# Patient Record
Sex: Female | Born: 1980
Health system: Southern US, Community
[De-identification: ages and names within clinical notes are randomized; demographics above are authoritative.]

## PROBLEM LIST (undated history)

## (undated) ENCOUNTER — Inpatient Hospital Stay (HOSPITAL_COMMUNITY): Payer: Self-pay

## (undated) DIAGNOSIS — R51 Headache: Secondary | ICD-10-CM

## (undated) DIAGNOSIS — R519 Headache, unspecified: Secondary | ICD-10-CM

## (undated) DIAGNOSIS — F329 Major depressive disorder, single episode, unspecified: Secondary | ICD-10-CM

## (undated) DIAGNOSIS — E119 Type 2 diabetes mellitus without complications: Secondary | ICD-10-CM

## (undated) DIAGNOSIS — F909 Attention-deficit hyperactivity disorder, unspecified type: Secondary | ICD-10-CM

## (undated) DIAGNOSIS — F32A Depression, unspecified: Secondary | ICD-10-CM

## (undated) HISTORY — PX: NO PAST SURGERIES: SHX2092

## (undated) HISTORY — DX: Type 2 diabetes mellitus without complications: E11.9

---

## 2007-06-24 ENCOUNTER — Ambulatory Visit (HOSPITAL_COMMUNITY): Admission: RE | Admit: 2007-06-24 | Discharge: 2007-06-24 | Payer: Self-pay | Admitting: Obstetrics & Gynecology

## 2007-09-16 ENCOUNTER — Ambulatory Visit (HOSPITAL_COMMUNITY): Admission: RE | Admit: 2007-09-16 | Discharge: 2007-09-16 | Payer: Self-pay | Admitting: Obstetrics & Gynecology

## 2007-11-08 ENCOUNTER — Inpatient Hospital Stay (HOSPITAL_COMMUNITY): Admission: AD | Admit: 2007-11-08 | Discharge: 2007-11-08 | Payer: Self-pay | Admitting: Obstetrics & Gynecology

## 2007-11-22 ENCOUNTER — Inpatient Hospital Stay (HOSPITAL_COMMUNITY): Admission: AD | Admit: 2007-11-22 | Discharge: 2007-11-24 | Payer: Self-pay | Admitting: Obstetrics & Gynecology

## 2010-12-04 ENCOUNTER — Encounter: Payer: Self-pay | Admitting: Obstetrics & Gynecology

## 2011-08-01 ENCOUNTER — Emergency Department (HOSPITAL_COMMUNITY)
Admission: EM | Admit: 2011-08-01 | Discharge: 2011-08-01 | Disposition: A | Discharge status: Home / Self Care | Payer: Medicaid Other | Attending: Emergency Medicine | Admitting: Emergency Medicine

## 2011-08-01 DIAGNOSIS — M79609 Pain in unspecified limb: Secondary | ICD-10-CM | POA: Insufficient documentation

## 2011-08-01 DIAGNOSIS — X503XXA Overexertion from repetitive movements, initial encounter: Secondary | ICD-10-CM | POA: Insufficient documentation

## 2011-08-01 DIAGNOSIS — S4490XA Injury of unspecified nerve at shoulder and upper arm level, unspecified arm, initial encounter: Secondary | ICD-10-CM | POA: Insufficient documentation

## 2011-08-01 DIAGNOSIS — M25519 Pain in unspecified shoulder: Secondary | ICD-10-CM | POA: Insufficient documentation

## 2011-08-03 LAB — CBC
Hemoglobin: 11.2 — ABNORMAL LOW
MCHC: 33
MCHC: 33.3
MCV: 76.3 — ABNORMAL LOW
MCV: 76.6 — ABNORMAL LOW
RBC: 4.44
WBC: 9.5

## 2011-08-03 LAB — RPR: RPR Ser Ql: NONREACTIVE

## 2011-08-18 LAB — URINALYSIS, ROUTINE W REFLEX MICROSCOPIC
Specific Gravity, Urine: 1.015
pH: 5.5

## 2012-09-05 DIAGNOSIS — F988 Other specified behavioral and emotional disorders with onset usually occurring in childhood and adolescence: Secondary | ICD-10-CM | POA: Insufficient documentation

## 2012-09-05 DIAGNOSIS — F419 Anxiety disorder, unspecified: Secondary | ICD-10-CM | POA: Insufficient documentation

## 2012-12-19 ENCOUNTER — Emergency Department (HOSPITAL_COMMUNITY)
Admission: EM | Admit: 2012-12-19 | Discharge: 2012-12-20 | Disposition: A | Discharge status: Home / Self Care | Payer: Medicaid Other | Attending: Emergency Medicine | Admitting: Emergency Medicine

## 2012-12-19 ENCOUNTER — Emergency Department (HOSPITAL_COMMUNITY): Payer: Medicaid Other

## 2012-12-19 ENCOUNTER — Encounter (HOSPITAL_COMMUNITY): Payer: Self-pay | Admitting: Cardiology

## 2012-12-19 DIAGNOSIS — R5381 Other malaise: Secondary | ICD-10-CM | POA: Insufficient documentation

## 2012-12-19 DIAGNOSIS — R42 Dizziness and giddiness: Secondary | ICD-10-CM | POA: Insufficient documentation

## 2012-12-19 DIAGNOSIS — Z7982 Long term (current) use of aspirin: Secondary | ICD-10-CM | POA: Insufficient documentation

## 2012-12-19 DIAGNOSIS — IMO0001 Reserved for inherently not codable concepts without codable children: Secondary | ICD-10-CM | POA: Insufficient documentation

## 2012-12-19 DIAGNOSIS — R110 Nausea: Secondary | ICD-10-CM | POA: Insufficient documentation

## 2012-12-19 DIAGNOSIS — Z3202 Encounter for pregnancy test, result negative: Secondary | ICD-10-CM | POA: Insufficient documentation

## 2012-12-19 DIAGNOSIS — R51 Headache: Secondary | ICD-10-CM | POA: Insufficient documentation

## 2012-12-19 DIAGNOSIS — H53149 Visual discomfort, unspecified: Secondary | ICD-10-CM | POA: Insufficient documentation

## 2012-12-19 DIAGNOSIS — Z79899 Other long term (current) drug therapy: Secondary | ICD-10-CM | POA: Insufficient documentation

## 2012-12-19 DIAGNOSIS — R1012 Left upper quadrant pain: Secondary | ICD-10-CM | POA: Insufficient documentation

## 2012-12-19 MED ORDER — KETOROLAC TROMETHAMINE 30 MG/ML IJ SOLN
30.0000 mg | Freq: Once | INTRAMUSCULAR | Status: AC
  Administered 2012-12-19: 30 mg via INTRAVENOUS
  Filled 2012-12-19: qty 1

## 2012-12-19 MED ORDER — ONDANSETRON 4 MG PO TBDP
4.0000 mg | ORAL_TABLET | Freq: Once | ORAL | Status: AC
  Administered 2012-12-19: 4 mg via ORAL
  Filled 2012-12-19: qty 1

## 2012-12-19 MED ORDER — PROMETHAZINE HCL 25 MG/ML IJ SOLN
25.0000 mg | Freq: Once | INTRAMUSCULAR | Status: AC
  Administered 2012-12-19: 25 mg via INTRAVENOUS
  Filled 2012-12-19: qty 1

## 2012-12-19 MED ORDER — METOCLOPRAMIDE HCL 5 MG/ML IJ SOLN
10.0000 mg | Freq: Once | INTRAMUSCULAR | Status: AC
  Administered 2012-12-19: 10 mg via INTRAVENOUS
  Filled 2012-12-19: qty 2

## 2012-12-19 MED ORDER — MORPHINE SULFATE 4 MG/ML IJ SOLN: 4.0000 mg | Freq: Once | INTRAMUSCULAR | Status: DC

## 2012-12-19 NOTE — ED Notes (Signed)
Pt states was given Toradol around 1:30pm from doctors office and did not help pain; pt states doctor told her to come here to be evaluated further; pt has no hx of migraines; pt denies numbness and tingling; denies nausea; pt states dizzy/lightheaded upon movement. Pt alert and mentating appropriately; pt states lights bothering her.

## 2012-12-19 NOTE — ED Notes (Signed)
Pt called for vital signs recheck x 1 with no answer.

## 2012-12-19 NOTE — ED Notes (Signed)
Pt reports a headache that started on Tuesday and has continued to get worse. Reports that she went to her PCP and received toradol but pain has gotten worse. Also reports upper quadrant abd pain. Denies any changes in her vision but does feel dizzy.

## 2012-12-20 NOTE — ED Provider Notes (Signed)
Medical screening examination/treatment/procedure(s) were conducted as a shared visit with non-physician practitioner(s) and myself.  I personally evaluated the patient during the encounter. Headache. Non-focal. Doubt meningitis. Feels better after treatment. Will d/c  Juliet Rude. Rubin Payor, MD 12/20/12 1441

## 2012-12-20 NOTE — ED Provider Notes (Signed)
History     CSN: [id number]  Arrival date & time 12/19/12  1829   First MD Initiated Contact with Patient 12/19/12 2121      Chief Complaint  Patient presents with  . Headache  . Abdominal Pain    (Consider location/radiation/quality/duration/timing/severity/associated sxs/prior treatment) Patient is a 32 y.o. female presenting with headaches. The history is provided by the patient.  Headache  This is a new problem. The problem has been gradually worsening. The headache is associated with bright light. The pain is located in the frontal region. The quality of the pain is described as dull. The pain is at a severity of 10/10. The pain is severe. The pain does not radiate. Associated symptoms include malaise/fatigue and nausea. Pertinent negatives include no fever, no palpitations, no shortness of breath and no vomiting. Associated symptoms comments: photophobia. Treatments tried: Extra strength tylenol helpd the first day. Went to PCP today and receivd toradol which helped for a few hours but then pain returned without relief.    History reviewed. No pertinent past medical history.  History reviewed. No pertinent past surgical history.  History reviewed. No pertinent family history.  History  Substance Use Topics  . Smoking status: Never Smoker   . Smokeless tobacco: Not on file  . Alcohol Use: No    OB History    Grav Para Term Preterm Abortions TAB SAB Ect Mult Living                  Review of Systems  Constitutional: Positive for malaise/fatigue. Negative for fever and diaphoresis.  HENT: Negative for neck pain and neck stiffness.   Eyes: Positive for photophobia. Negative for visual disturbance.  Respiratory: Negative for apnea, chest tightness and shortness of breath.   Cardiovascular: Negative for chest pain and palpitations.  Gastrointestinal: Positive for nausea. Negative for vomiting, diarrhea and constipation.  Genitourinary: Negative for dysuria.   Musculoskeletal: Negative for gait problem.  Skin: Negative for rash.  Neurological: Positive for headaches. Negative for dizziness, weakness, light-headedness and numbness.    Allergies  Review of patient's allergies indicates no known allergies.  Home Medications   Current Outpatient Rx  Name  Route  Sig  Dispense  Refill  . ACETAMINOPHEN 500 MG PO TABS   Oral   Take 500 mg by mouth every 6 (six) hours as needed. For pain         . AMPHETAMINE-DEXTROAMPHET ER 30 MG PO CP24   Oral   Take 30 mg by mouth daily.         . ASPIRIN PO   Oral   Take 2 tablets by mouth daily as needed. For headache         . CITALOPRAM HYDROBROMIDE 10 MG PO TABS   Oral   Take 10 mg by mouth daily.         . ETONOGESTREL-ETHINYL ESTRADIOL 0.12-0.015 MG/24HR VA RING   Vaginal   Place 1 each vaginally every 28 (twenty-eight) days. Insert vaginally and leave in place for 3 consecutive weeks, then remove for 1 week.         . TORADOL ORAL PO   Oral   Take 1 each by mouth once.         . ADULT MULTIVITAMIN W/MINERALS CH   Oral   Take 1 tablet by mouth daily.           BP 121/59  Pulse 108  Temp 98.1 F (36.7 C) (Oral)  Resp 18  SpO2 100%  LMP 10/16/2012  Physical Exam  Nursing note and vitals reviewed. Constitutional: She is oriented to person, place, and time. She appears well-developed.       Ill appearing  HENT:  Head: Normocephalic and atraumatic.  Eyes: Conjunctivae normal and EOM are normal. Pupils are equal, round, and reactive to light.  Neck: Normal range of motion. Neck supple.       No meningeal signs  Cardiovascular: Normal rate, regular rhythm and normal heart sounds.  Exam reveals no gallop and no friction rub.   No murmur heard. Pulmonary/Chest: Effort normal and breath sounds normal. No respiratory distress. She has no wheezes. She has no rales. She exhibits no tenderness.  Abdominal: Soft. Bowel sounds are normal. She exhibits no distension. There is  no tenderness. There is no rebound and no guarding.       Abdominal discomfort, LUQ. Not tender to palpation.  Musculoskeletal: Normal range of motion. She exhibits no edema and no tenderness.       Good grip strength. 5/5 muscle strength throughout. FROM  Neurological: She is alert and oriented to person, place, and time. No cranial nerve deficit.       No focal deficits, sensitivity to light touch intact, no facial droop, no pronator drift  Skin: Skin is warm and dry. No erythema.    ED Course  Procedures (including critical care time)   Labs Reviewed  PREGNANCY, URINE   Ct Head Wo Contrast  12/19/2012  *RADIOLOGY REPORT*  Clinical Data: Dizziness and headache.  Irregular periods.  Birth control.  CT HEAD WITHOUT CONTRAST  Technique:  Contiguous axial images were obtained from the base of the skull through the vertex without contrast.  Comparison: None.  Findings: No intracranial hemorrhage.  No hydrocephalus.  No CT evidence of large acute infarct.  No intracranial mass lesion detected on this unenhanced exam.  Sella appears partially empty.  Slight asymmetry of the crista gali but without mass seen.  Visualized sinuses, mastoid air cells and middle ear cavities are clear.  IMPRESSION: No acute abnormality.  Please see above.   Original Report Authenticated By: Lacy Duverney, M.D.      1. Headache       MDM  Neuro exam shows no deficit. Cranial nerves intact. Ct negative. Headache cocktail successful bringing pain from 10/10 to 0/10. Pt looks much better. Ready to go home. Will follow up with Dr. Cyndia Bent.  At this time there does not appear to be any evidence of an acute emergency medical condition and the patient appears stable for discharge with appropriate outpatient follow up.Diagnosis was discussed with patient who verbalizes understanding and is agreeable to discharge. Pt case discussed with Dr. Rubin Payor who agrees with my plan.    Glade Nurse, PA-C 12/20/12 [phone]

## 2014-02-25 IMAGING — CT CT HEAD W/O CM
1 series · 16 of 30 positions shown, 20 images · non-contrast
Comparison: None.

CLINICAL DATA: Dizziness and headache.  Irregular periods.  Birth
control.

CT HEAD WITHOUT CONTRAST
TECHNIQUE: Contiguous axial images were obtained from the base of
the skull through the vertex without contrast.

[Series 2: head routine 4.8 h37s · axial · 0.43mm/px · z∈[+1130,+1267]mm · 16 of 30 slices shown, 20 images]
[im 2/30  brain]
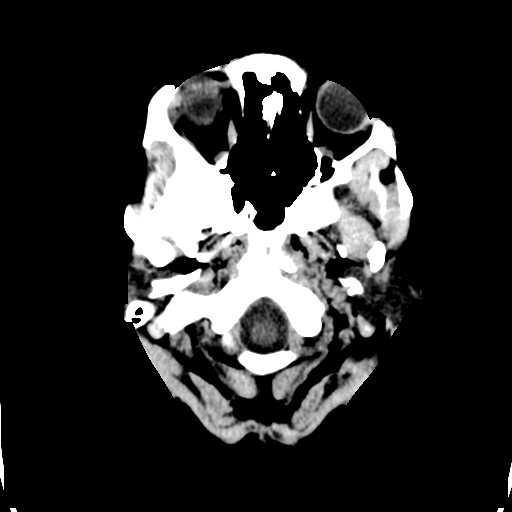
[im 2/30  bone]
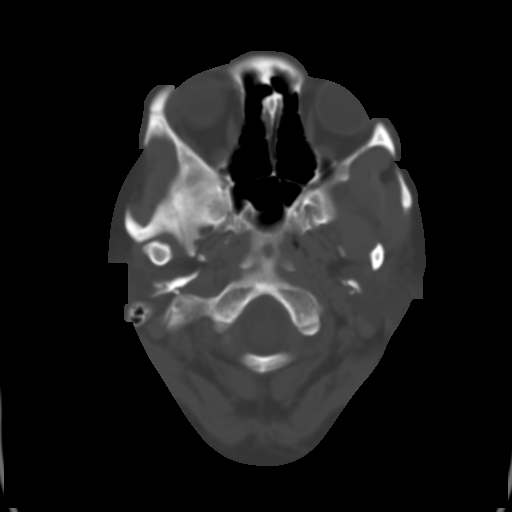
[im 4/30  brain]
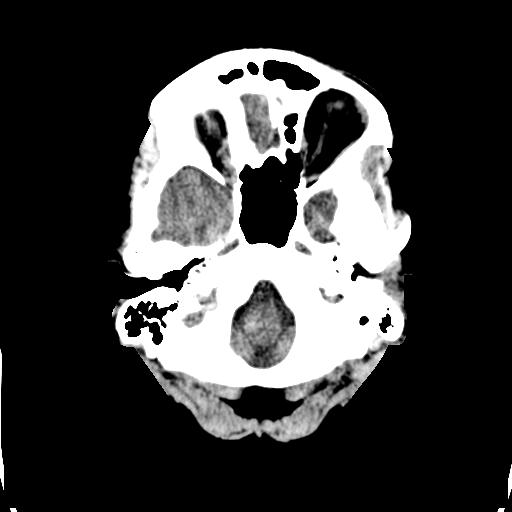
[im 6/30  brain]
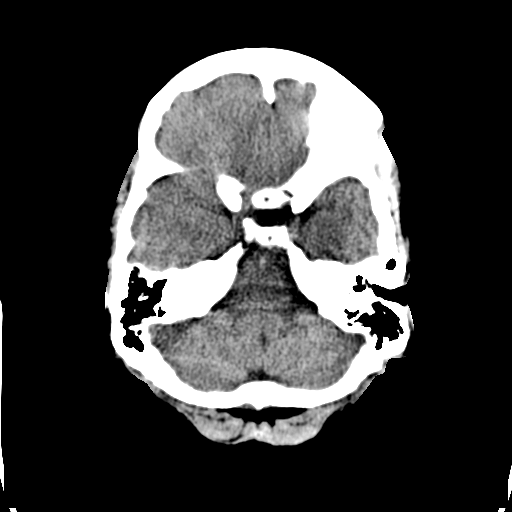
[im 8/30  brain]
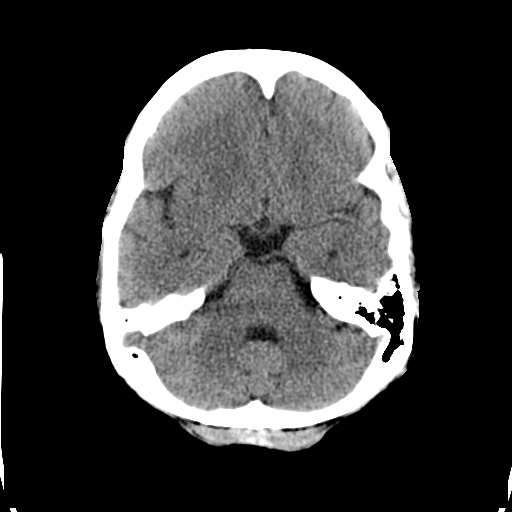
[im 9/30  brain]
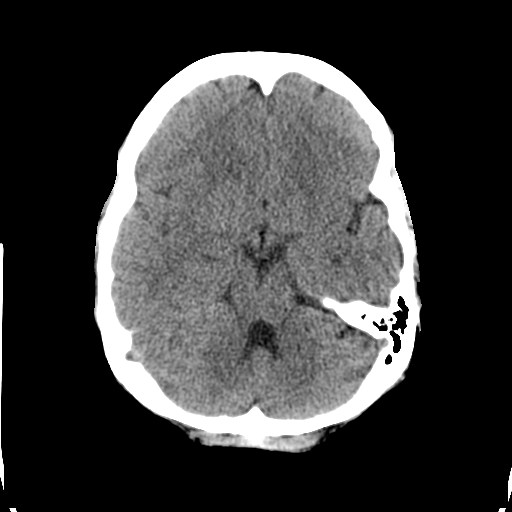
[im 9/30  bone]
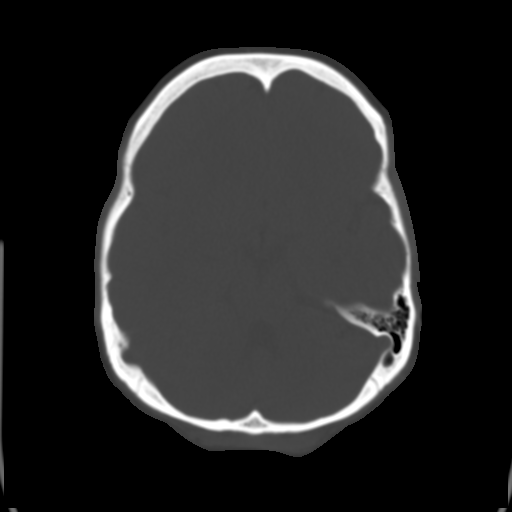
[im 11/30  brain]
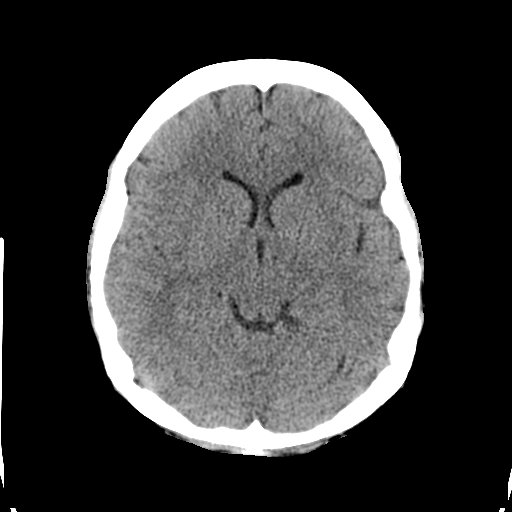
[im 13/30  brain]
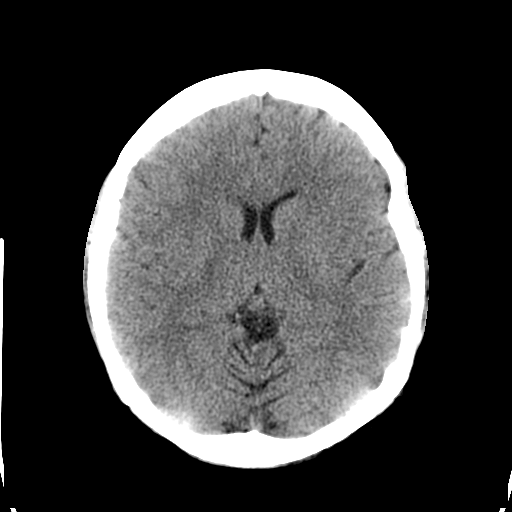
[im 15/30  brain]
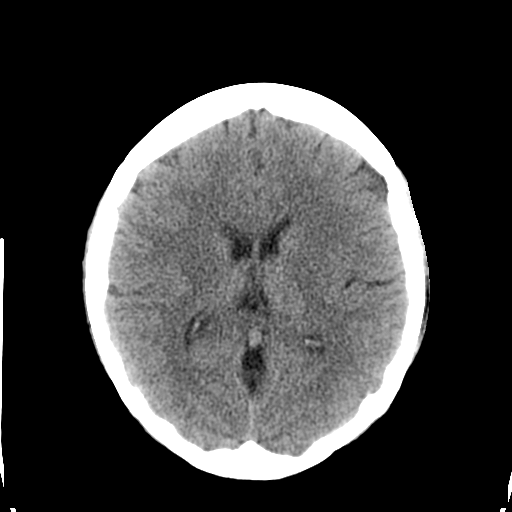
[im 16/30  brain]
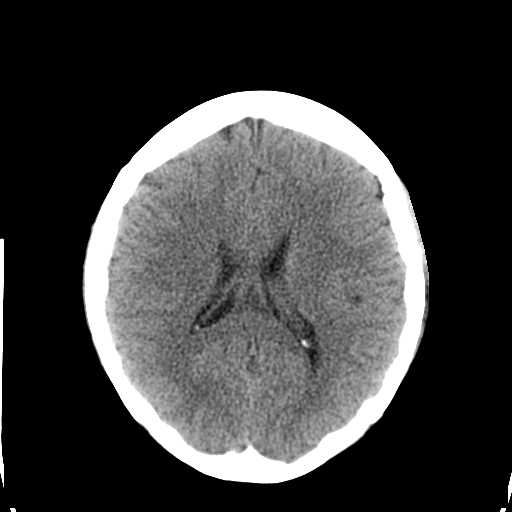
[im 16/30  bone]
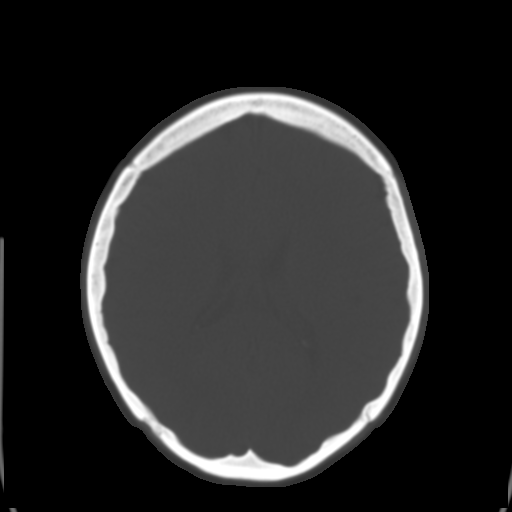
[im 18/30  brain]
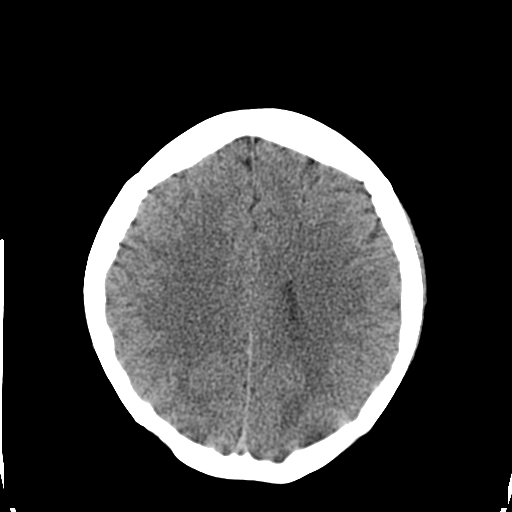
[im 20/30  brain]
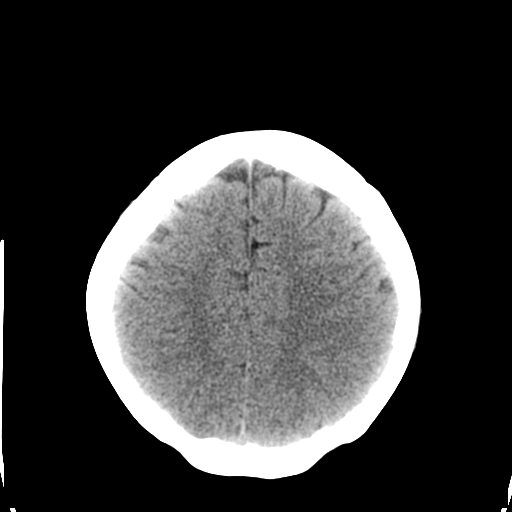
[im 22/30  brain]
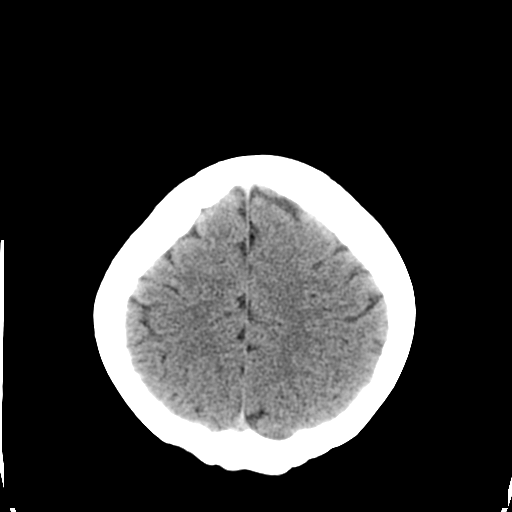
[im 23/30  brain]
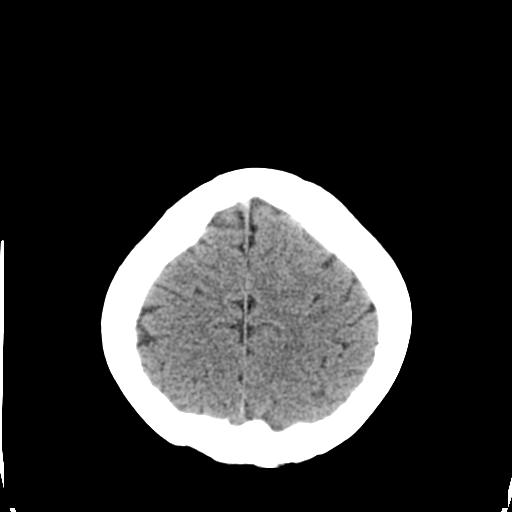
[im 23/30  bone]
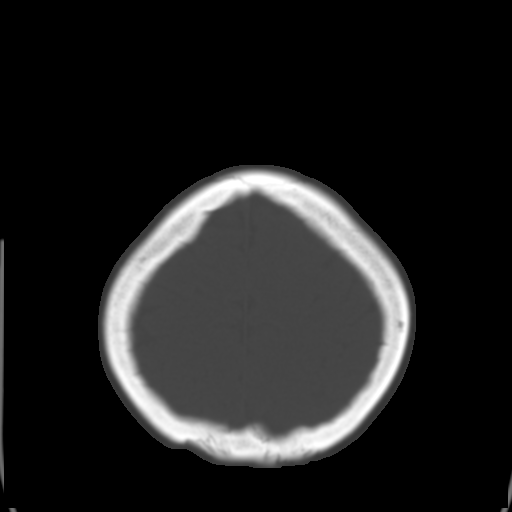
[im 25/30  brain]
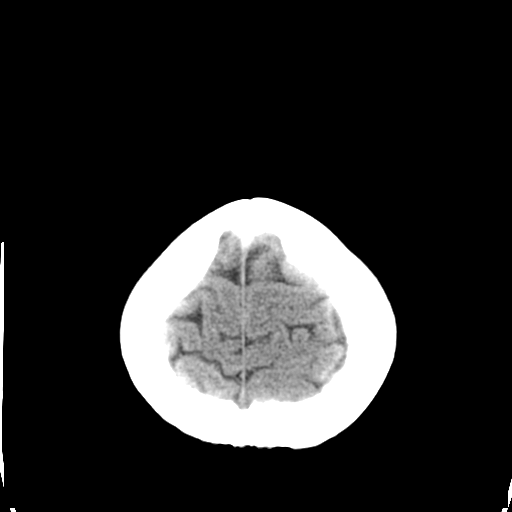
[im 27/30  brain]
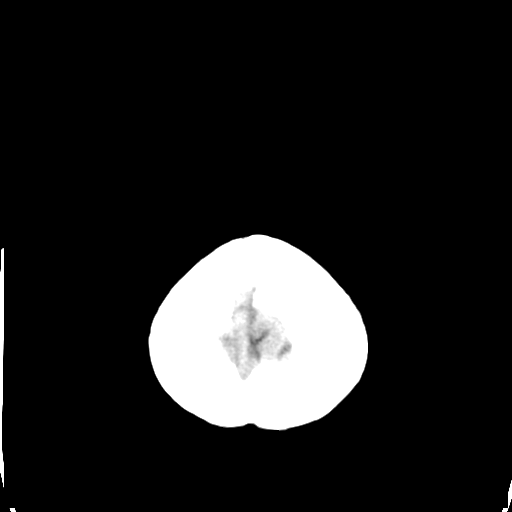
[im 29/30  brain]
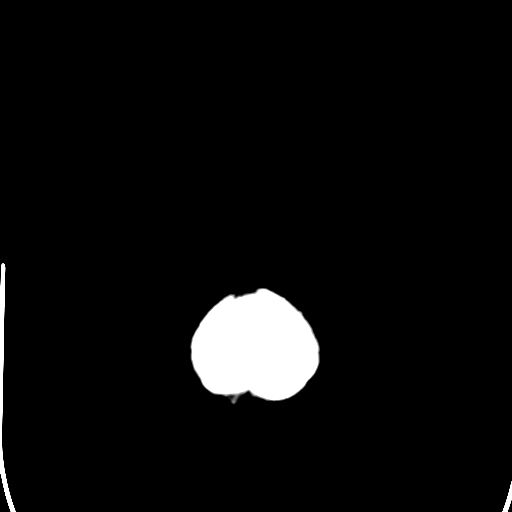

[16 of 30 positions shown; findings below may reference images not displayed]

FINDINGS: No intracranial hemorrhage.

No hydrocephalus.

No CT evidence of large acute infarct.

No intracranial mass lesion detected on this unenhanced exam..

Sella appears partially empty.

Slight asymmetry of the Saj Gray but without mass seen.

Visualized sinuses, mastoid air cells and middle ear cavities are
clear.
IMPRESSION: No acute abnormality.  Please see above.

## 2014-03-24 ENCOUNTER — Encounter (HOSPITAL_COMMUNITY): Payer: Self-pay | Admitting: Emergency Medicine

## 2014-03-24 ENCOUNTER — Emergency Department (INDEPENDENT_AMBULATORY_CARE_PROVIDER_SITE_OTHER)
Admission: EM | Admit: 2014-03-24 | Discharge: 2014-03-24 | Disposition: A | Discharge status: Home / Self Care | Payer: 59 | Source: Home / Self Care

## 2014-03-24 DIAGNOSIS — K2940 Chronic atrophic gastritis without bleeding: Secondary | ICD-10-CM

## 2014-03-24 DIAGNOSIS — A048 Other specified bacterial intestinal infections: Secondary | ICD-10-CM

## 2014-03-24 DIAGNOSIS — B9681 Helicobacter pylori [H. pylori] as the cause of diseases classified elsewhere: Secondary | ICD-10-CM

## 2014-03-24 DIAGNOSIS — K2970 Gastritis, unspecified, without bleeding: Principal | ICD-10-CM

## 2014-03-24 LAB — POCT H PYLORI SCREEN: H. PYLORI SCREEN, POC: POSITIVE — AB

## 2014-03-24 MED ORDER — GI COCKTAIL ~~LOC~~: 30.0000 mL | Freq: Once | ORAL | Status: DC

## 2014-03-24 MED ORDER — AMOXICILLIN 500 MG PO CAPS: 1000.0000 mg | ORAL_CAPSULE | Freq: Two times a day (BID) | ORAL | Status: DC

## 2014-03-24 MED ORDER — GI COCKTAIL ~~LOC~~
ORAL | Status: AC
  Filled 2014-03-24: qty 30

## 2014-03-24 MED ORDER — OMEPRAZOLE 20 MG PO CPDR: 20.0000 mg | DELAYED_RELEASE_CAPSULE | Freq: Every day | ORAL | Status: DC

## 2014-03-24 MED ORDER — CLARITHROMYCIN 500 MG PO TABS: 500.0000 mg | ORAL_TABLET | Freq: Two times a day (BID) | ORAL | Status: DC

## 2014-03-24 NOTE — ED Notes (Signed)
C/o epigastric pain , pressure, bloated sensation. Kids have a stomach bug, but she denies any n/v/d

## 2014-03-24 NOTE — ED Provider Notes (Signed)
Medical screening examination/treatment/procedure(s) were performed by resident physician or non-physician practitioner and as supervising physician I was immediately available for consultation/collaboration.   Pauline Good MD.   Billy Fischer, MD 03/24/14 1719

## 2014-03-24 NOTE — ED Provider Notes (Signed)
CSN: [id number]     Arrival date & time 03/24/14  2952 History   First MD Initiated Contact with Patient 03/24/14 1004     Chief Complaint  Patient presents with  . Bloated   (Consider location/radiation/quality/duration/timing/severity/associated sxs/prior Treatment) HPI Comments: 33 y o F with epigastric pain for 48 hrs, feels more like bloating or fullness. No N,V,D,C Eating makes it worse.  So far OTC meds tried not helping.   History reviewed. No pertinent past medical history. History reviewed. No pertinent past surgical history. History reviewed. No pertinent family history. History  Substance Use Topics  . Smoking status: Never Smoker   . Smokeless tobacco: Not on file  . Alcohol Use: No   OB History   Grav Para Term Preterm Abortions TAB SAB Ect Mult Living                 Review of Systems  Constitutional: Negative.   HENT: Negative.   Respiratory: Negative.   Cardiovascular: Negative for chest pain.  Gastrointestinal: Positive for abdominal pain. Negative for nausea, vomiting, diarrhea, constipation, blood in stool and abdominal distention.  Genitourinary: Negative.   Neurological: Negative.     Allergies  Review of patient's allergies indicates no known allergies.  Home Medications   Prior to Admission medications   Medication Sig Start Date End Date Taking? Authorizing Provider  acetaminophen (TYLENOL) 500 MG tablet Take 500 mg by mouth every 6 (six) hours as needed. For pain    Historical Provider, MD  amphetamine-dextroamphetamine (ADDERALL XR) 30 MG 24 hr capsule Take 30 mg by mouth daily.    Historical Provider, MD  ASPIRIN PO Take 2 tablets by mouth daily as needed. For headache    Historical Provider, MD  citalopram (CELEXA) 10 MG tablet Take 10 mg by mouth daily.    Historical Provider, MD  etonogestrel-ethinyl estradiol (NUVARING) 0.12-0.015 MG/24HR vaginal ring Place 1 each vaginally every 28 (twenty-eight) days. Insert vaginally and leave in place  for 3 consecutive weeks, then remove for 1 week.    Historical Provider, MD  Ketorolac Tromethamine (TORADOL ORAL PO) Take 1 each by mouth once.    Historical Provider, MD  Multiple Vitamin (MULTIVITAMIN WITH MINERALS) TABS Take 1 tablet by mouth daily.    Historical Provider, MD   BP 119/84  Pulse 88  Temp(Src) 98.6 F (37 C) (Oral)  Resp 16  SpO2 98% Physical Exam  Nursing note and vitals reviewed. Constitutional: She is oriented to person, place, and time. She appears well-developed and well-nourished. No distress.  Cardiovascular: Normal rate, regular rhythm and intact distal pulses.   Pulmonary/Chest: Effort normal and breath sounds normal. No respiratory distress. She has no wheezes. She has no rales.  Abdominal: Soft. Bowel sounds are normal. She exhibits no distension and no mass. There is no rebound and no guarding.  Tenderness in the epigastrium. No tenderness to the RUQ, neg Murphy's.  Neurological: She is alert and oriented to person, place, and time.  Skin: Skin is warm and dry.  Psychiatric: She has a normal mood and affect.    ED Course  Procedures (including critical care time) Labs Review Labs Reviewed  POCT H PYLORI SCREEN - Abnormal; Notable for the following:    H. PYLORI SCREEN, POC POSITIVE (*)    All other components within normal limits    Imaging Review No results found.   MDM   1. Helicobacter positive gastritis    Amoxil and clarithromycin and omeprazole combination for 12 d. Gastritis  sheet.     Janne Napoleon, NP 03/24/14 1118

## 2014-03-24 NOTE — Discharge Instructions (Signed)

## 2014-04-15 ENCOUNTER — Other Ambulatory Visit: Payer: Self-pay | Admitting: Obstetrics

## 2014-05-10 ENCOUNTER — Emergency Department (INDEPENDENT_AMBULATORY_CARE_PROVIDER_SITE_OTHER)
Admission: EM | Admit: 2014-05-10 | Discharge: 2014-05-10 | Disposition: A | Discharge status: Nursing Home (Includes ICF) | Payer: 59 | Source: Home / Self Care | Attending: Emergency Medicine | Admitting: Emergency Medicine

## 2014-05-10 ENCOUNTER — Emergency Department (HOSPITAL_COMMUNITY): Payer: 59

## 2014-05-10 ENCOUNTER — Encounter (HOSPITAL_COMMUNITY): Payer: Self-pay | Admitting: Emergency Medicine

## 2014-05-10 ENCOUNTER — Emergency Department (HOSPITAL_COMMUNITY)
Admission: EM | Admit: 2014-05-10 | Discharge: 2014-05-10 | Disposition: A | Discharge status: Home / Self Care | Payer: 59 | Attending: Emergency Medicine | Admitting: Emergency Medicine

## 2014-05-10 DIAGNOSIS — Z79899 Other long term (current) drug therapy: Secondary | ICD-10-CM | POA: Insufficient documentation

## 2014-05-10 DIAGNOSIS — R079 Chest pain, unspecified: Secondary | ICD-10-CM

## 2014-05-10 DIAGNOSIS — R0789 Other chest pain: Secondary | ICD-10-CM

## 2014-05-10 DIAGNOSIS — Z3202 Encounter for pregnancy test, result negative: Secondary | ICD-10-CM | POA: Diagnosis not present

## 2014-05-10 DIAGNOSIS — R071 Chest pain on breathing: Secondary | ICD-10-CM | POA: Diagnosis not present

## 2014-05-10 LAB — BASIC METABOLIC PANEL
BUN: 11 mg/dL (ref 6–23)
CHLORIDE: 101 meq/L (ref 96–112)
CO2: 19 meq/L (ref 19–32)
Calcium: 8.4 mg/dL (ref 8.4–10.5)
Creatinine, Ser: 0.42 mg/dL — ABNORMAL LOW (ref 0.50–1.10)
GFR calc Af Amer: >90 mL/min (ref >90)
GFR calc non Af Amer: >90 mL/min (ref >90)
GLUCOSE: 91 mg/dL (ref 70–99)
POTASSIUM: 3.7 meq/L (ref 3.7–5.3)
SODIUM: 136 meq/L — AB (ref 137–147)

## 2014-05-10 LAB — URINALYSIS, ROUTINE W REFLEX MICROSCOPIC
Bilirubin Urine: NEGATIVE
Glucose, UA: NEGATIVE mg/dL
HGB URINE DIPSTICK: NEGATIVE
Ketones, ur: NEGATIVE mg/dL
NITRITE: NEGATIVE
Protein, ur: NEGATIVE mg/dL
SPECIFIC GRAVITY, URINE: 1.025 (ref 1.005–1.030)
UROBILINOGEN UA: 0.2 mg/dL (ref 0.0–1.0)
pH: 5.5 (ref 5.0–8.0)

## 2014-05-10 LAB — CBC WITH DIFFERENTIAL/PLATELET
BASOS ABS: 0 10*3/uL (ref 0.0–0.1)
Basophils Relative: 0 % (ref 0–1)
Eosinophils Absolute: 0.1 10*3/uL (ref 0.0–0.7)
Eosinophils Relative: 2 % (ref 0–5)
HCT: 35.4 % — ABNORMAL LOW (ref 36.0–46.0)
Hemoglobin: 11.4 g/dL — ABNORMAL LOW (ref 12.0–15.0)
LYMPHS ABS: 2.5 10*3/uL (ref 0.7–4.0)
LYMPHS PCT: 34 % (ref 12–46)
MCH: 27 pg (ref 26.0–34.0)
MCHC: 32.2 g/dL (ref 30.0–36.0)
MCV: 83.9 fL (ref 78.0–100.0)
Monocytes Absolute: 0.3 10*3/uL (ref 0.1–1.0)
Monocytes Relative: 5 % (ref 3–12)
NEUTROS ABS: 4.3 10*3/uL (ref 1.7–7.7)
NEUTROS PCT: 59 % (ref 43–77)
PLATELETS: 257 10*3/uL (ref 150–400)
RBC: 4.22 MIL/uL (ref 3.87–5.11)
RDW: 13.5 % (ref 11.5–15.5)
WBC: 7.3 10*3/uL (ref 4.0–10.5)

## 2014-05-10 LAB — URINE MICROSCOPIC-ADD ON

## 2014-05-10 LAB — TROPONIN I

## 2014-05-10 LAB — PREGNANCY, URINE: PREG TEST UR: NEGATIVE

## 2014-05-10 LAB — D-DIMER, QUANTITATIVE (NOT AT ARMC)

## 2014-05-10 MED ORDER — SODIUM CHLORIDE 0.9 % IV SOLN: INTRAVENOUS | Status: DC

## 2014-05-10 MED ORDER — KETOROLAC TROMETHAMINE 30 MG/ML IJ SOLN
30.0000 mg | Freq: Once | INTRAMUSCULAR | Status: AC
  Administered 2014-05-10: 30 mg via INTRAVENOUS
  Filled 2014-05-10: qty 1

## 2014-05-10 MED ORDER — IBUPROFEN 800 MG PO TABS: 800.0000 mg | ORAL_TABLET | Freq: Three times a day (TID) | ORAL | Status: DC

## 2014-05-10 NOTE — ED Provider Notes (Signed)
CSN: [id number]     Arrival date & time 05/10/14  1346 History   First MD Initiated Contact with Patient 05/10/14 1355     Chief Complaint  Patient presents with  . Chest Pain     (Consider location/radiation/quality/duration/timing/severity/associated sxs/prior Treatment) HPI Comments: L sided chest and upper back pain that onset at 9am.  Denies lifting or trauma.  No history of similar pain. Denies SOB, nausea, diaphoresis. Pain is worse with movement and deep breathing. Pain has been constant at 9 AM. It is worse with deep breathing and movement. She denies any cardiac history. She denies any dizziness, nausea diaphoresis. She denies any lifting injury though she has been painting a room in her. She denies any early family history of heart disease. She does not smoke. She denies any recent long car trips or plane trips. She is on NuvaRing birth control. Denies any leg pain leg swelling  The history is provided by the patient and the EMS personnel.    History reviewed. No pertinent past medical history. History reviewed. No pertinent past surgical history. No family history on file. History  Substance Use Topics  . Smoking status: Never Smoker   . Smokeless tobacco: Not on file  . Alcohol Use: No   OB History   Grav Para Term Preterm Abortions TAB SAB Ect Mult Living                 Review of Systems  Constitutional: Negative for fever, activity change and appetite change.  Respiratory: Negative for cough, chest tightness and shortness of breath.   Cardiovascular: Positive for chest pain.  Gastrointestinal: Negative for nausea, vomiting and abdominal pain.  Genitourinary: Negative for dysuria, hematuria, vaginal bleeding and vaginal discharge.  Musculoskeletal: Negative for arthralgias and myalgias.  Neurological: Negative for dizziness, weakness and headaches.  A complete 10 system review of systems was obtained and all systems are negative except as noted in the HPI and PMH.       Allergies  Review of patient's allergies indicates no known allergies.  Home Medications   Prior to Admission medications   Medication Sig Start Date End Date Taking? Authorizing Provider  CALCIUM-VITAMIN D PO Take 1 tablet by mouth daily.   Yes Historical Provider, MD  citalopram (CELEXA) 20 MG tablet Take 20 mg by mouth daily.   Yes Historical Provider, MD  etonogestrel-ethinyl estradiol (NUVARING) 0.12-0.015 MG/24HR vaginal ring Place 1 each vaginally every 28 (twenty-eight) days. Insert vaginally and leave in place for 3 consecutive weeks, then remove for 1 week.   Yes Historical Provider, MD  lisdexamfetamine (VYVANSE) 40 MG capsule Take 40 mg by mouth every morning.   Yes Historical Provider, MD  loratadine (CLARITIN) 10 MG tablet Take 10 mg by mouth daily.   Yes Historical Provider, MD  ibuprofen (ADVIL,MOTRIN) 800 MG tablet Take 1 tablet (800 mg total) by mouth 3 (three) times daily. 05/10/14   Ezequiel Essex, MD   BP 110/81  Pulse 87  Temp(Src) 98 F (36.7 C) (Oral)  Resp 19  SpO2 100% Physical Exam  Nursing note and vitals reviewed. Constitutional: She is oriented to person, place, and time. She appears well-developed and well-nourished. No distress.  HENT:  Head: Normocephalic and atraumatic.  Mouth/Throat: Oropharynx is clear and moist. No oropharyngeal exudate.  Eyes: Conjunctivae and EOM are normal. Pupils are equal, round, and reactive to light.  Neck: Normal range of motion. Neck supple.  No meningismus.  Cardiovascular: Normal rate, regular rhythm, normal heart sounds  and intact distal pulses.   No murmur heard. Pulmonary/Chest: Effort normal and breath sounds normal. No respiratory distress. She exhibits tenderness.  Left chest wall tenderness worse with palpation left arm movement  Abdominal: Soft. There is no tenderness. There is no rebound and no guarding.  Musculoskeletal: Normal range of motion. She exhibits tenderness. She exhibits no edema.  Left  trapezius spasm  Neurological: She is alert and oriented to person, place, and time. No cranial nerve deficit. She exhibits normal muscle tone. Coordination normal.  No ataxia on finger to nose bilaterally. No pronator drift. 5/5 strength throughout. CN 2-12 intact. Negative Romberg. Equal grip strength. Sensation intact. Gait is normal.   Skin: Skin is warm.  Psychiatric: She has a normal mood and affect. Her behavior is normal.    ED Course  Procedures (including critical care time) Labs Review Labs Reviewed  CBC WITH DIFFERENTIAL - Abnormal; Notable for the following:    Hemoglobin 11.4 (*)    HCT 35.4 (*)    All other components within normal limits  BASIC METABOLIC PANEL - Abnormal; Notable for the following:    Sodium 136 (*)    Creatinine, Ser 0.42 (*)    All other components within normal limits  URINALYSIS, ROUTINE W REFLEX MICROSCOPIC - Abnormal; Notable for the following:    Leukocytes, UA SMALL (*)    All other components within normal limits  URINE MICROSCOPIC-ADD ON - Abnormal; Notable for the following:    Squamous Epithelial / LPF FEW (*)    Bacteria, UA FEW (*)    All other components within normal limits  TROPONIN I  D-DIMER, QUANTITATIVE  PREGNANCY, URINE    Imaging Review Dg Chest 2 View  05/10/2014   CLINICAL DATA:  CHEST PAIN  EXAM: CHEST - 2 VIEW  COMPARISON:  None available  FINDINGS: Lungs clear.  Heart size normal.  No   pneumothorax. No effusion. Visualized skeletal structures are unremarkable.  IMPRESSION: No acute cardiopulmonary disease.   Electronically Signed   By: Arne Cleveland M.D.   On: 05/10/2014 15:50     EKG Interpretation   Date/Time:  Sunday May 10 2014 14:12:54 EDT Ventricular Rate:  74 PR Interval:  124 QRS Duration: 78 QT Interval:  382 QTC Calculation: 424 R Axis:   58 Text Interpretation:  Sinus rhythm No significant change was found  Confirmed by Wyvonnia Dusky  MD, STEPHEN [id number]) on 05/10/2014 2:24:56 PM      MDM   Final  diagnoses:  Chest wall pain   6 hours of constant pain that comes and goes lasting a few seconds at a time. EKG unchanged. Pain very atypical for ACS or PE.  EKG is normal sinus rhythm. Chest x-ray is negative.  D-dimer is negative. Troponin is negative. Pain is reproducible to palpation and worse with deep breathing.  Suspect musculoskeletal chest pain. Low suspicion for ACS, PE, aortic dissection  Patient feels improved with anti-inflammatories. We'll treat for chest wall pain followup with her PCP. Return precautions discussed.  Ezequiel Essex, MD 05/10/14 [id number]

## 2014-05-10 NOTE — Discharge Instructions (Signed)
Your EKG today is normal. We are sending you to [facility] Emergency Department for further evaluation of your chest pain.   Chest Pain (Nonspecific) It is often hard to give a diagnosis for the cause of chest pain. There is always a chance that your pain could be related to something serious, such as a heart attack or a blood clot in the lungs. You need to follow up with your doctor. HOME CARE  If antibiotic medicine was given, take it as directed by your doctor. Finish the medicine even if you start to feel better.  For the next few days, avoid activities that bring on chest pain. Continue physical activities as told by your doctor.  Do not use any tobacco products. This includes cigarettes, chewing tobacco, and e-cigarettes.  Avoid drinking alcohol.  Only take medicine as told by your doctor.  Follow your doctor's suggestions for more testing if your chest pain does not go away.  Keep all doctor visits you made. GET HELP IF:  Your chest pain does not go away, even after treatment.  You have a rash with blisters on your chest.  You have a fever. GET HELP RIGHT AWAY IF:   You have more pain or pain that spreads to your arm, neck, jaw, back, or belly (abdomen).  You have shortness of breath.  You cough more than usual or cough up blood.  You have very bad back or belly pain.  You feel sick to your stomach (nauseous) or throw up (vomit).  You have very bad weakness.  You pass out (faint).  You have chills. This is an emergency. Do not wait to see if the problems will go away. Call your local emergency services (911 in [place] Do not drive yourself to the hospital. MAKE SURE YOU:   Understand these instructions.  Will watch your condition.  Will get help right away if you are not doing well or get worse. Document Released: 04/17/2008 Document Revised: 11/04/2013 Document Reviewed: 04/17/2008 [facility] Patient Information 2015 [place], [place]. This information is not intended  to replace advice given to you by your health care provider. Make sure you discuss any questions you have with your health care provider.

## 2014-05-10 NOTE — ED Notes (Signed)
Patient transported to X-ray 

## 2014-05-10 NOTE — ED Notes (Signed)
Pt moved from hallway to room; pt already in gown, placed on monitor, continuous pulse oximetry and blood pressure cuff

## 2014-05-10 NOTE — ED Notes (Signed)
Pt just received Toradol IV. Will wait 20 minutes before discharge to ensure no reaction.

## 2014-05-10 NOTE — Discharge Instructions (Signed)
Chest Wall Pain There is no evidence of heart attack or blood clot in the lung. Follow up with your doctor. Return to the Ed if you develop new or worsening symptoms. Chest wall pain is pain in or around the bones and muscles of your chest. It may take up to 6 weeks to get better. It may take longer if you must stay physically active in your work and activities.  CAUSES  Chest wall pain may happen on its own. However, it may be caused by:  A viral illness like the flu.  Injury.  Coughing.  Exercise.  Arthritis.  Fibromyalgia.  Shingles. HOME CARE INSTRUCTIONS   Avoid overtiring physical activity. Try not to strain or perform activities that cause pain. This includes any activities using your chest or your abdominal and side muscles, especially if heavy weights are used.  Put ice on the sore area.  Put ice in a plastic bag.  Place a towel between your skin and the bag.  Leave the ice on for 15-20 minutes per hour while awake for the first 2 days.  Only take over-the-counter or prescription medicines for pain, discomfort, or fever as directed by your caregiver. SEEK IMMEDIATE MEDICAL CARE IF:   Your pain increases, or you are very uncomfortable.  You have a fever.  Your chest pain becomes worse.  You have new, unexplained symptoms.  You have nausea or vomiting.  You feel sweaty or lightheaded.  You have a cough with phlegm (sputum), or you cough up blood. MAKE SURE YOU:   Understand these instructions.  Will watch your condition.  Will get help right away if you are not doing well or get worse. Document Released: 10/30/2005 Document Revised: 01/22/2012 Document Reviewed: 06/26/2011 [facility] Patient Information 2015 [employer], [place]. This information is not intended to replace advice given to you by your health care provider. Make sure you discuss any questions you have with your health care provider.

## 2014-05-10 NOTE — ED Notes (Signed)
Pt is here with chest pain with movement that started today.  No medications given, IV started and pt is here from urgent care for further workup.

## 2014-05-10 NOTE — ED Notes (Signed)
Reports acute on set of left sided chest pain with numbness in left arm.  Mild sob.  Denies n/v  And sweats.   No cardiac hx.  On set 1 1/2 hours ago.

## 2014-05-10 NOTE — ED Provider Notes (Signed)
CSN: [id number]     Arrival date & time 05/10/14  1124 History   First MD Initiated Contact with Patient 05/10/14 1208     Chief Complaint  Patient presents with  . Chest Pain    Patient is a 33 y.o. female presenting with chest pain. The history is provided by the patient.  Chest Pain Pain location:  L chest Pain quality: stabbing   Pain radiates to:  Upper back Pain radiates to the back: yes   Pain severity:  Moderate Onset quality:  Gradual Duration:  4 hours Timing:  Constant Progression:  Waxing and waning Chronicity:  New Context: breathing, movement and at rest   Relieved by:  None tried Worsened by:  Certain positions, deep breathing and movement Associated symptoms: back pain, palpitations and shortness of breath   Associated symptoms: no abdominal pain, no cough, no diaphoresis, no dizziness, no fever, no heartburn and no nausea   Risk factors: birth control   Risk factors: no aortic disease, no coronary artery disease, no high cholesterol, no hypertension and no prior DVT/PE   Pt reports having (L) sided CP since she awoke today that radiates to her (L) upper back. Pain is 5/10 but can become 9-10/10 w/ movement or deep breathing. She reports a sensation of SOB with deep breathing. Denies dizziness, nausea or diaphoresis. Admits pain is worse w/ some positions and w/ deep breathing. Denies heavy lifting or unusually strenuous activity over the last several days though states she has been painting an interior room in her home. Pt does not smoke, drink alcohol or use recreational drugs. Denies family h/o early deaths d/t CAD and denies any other health problems. Uses Neuvo-ring for birth control. Also takes Vyvanse and Celexa. No recent long distance travel.  History reviewed. No pertinent past medical history. History reviewed. No pertinent past surgical history. History reviewed. No pertinent family history. History  Substance Use Topics  . Smoking status: Never Smoker     . Smokeless tobacco: Not on file  . Alcohol Use: No   OB History   Grav Para Term Preterm Abortions TAB SAB Ect Mult Living                 Review of Systems  Constitutional: Negative for fever and diaphoresis.  HENT: Negative.   Eyes: Negative.   Respiratory: Positive for shortness of breath. Negative for cough and chest tightness.   Cardiovascular: Positive for chest pain and palpitations.  Gastrointestinal: Negative.  Negative for heartburn, nausea and abdominal pain.  Endocrine: Negative.   Genitourinary: Negative.   Musculoskeletal: Positive for back pain.  Skin: Negative.   Neurological: Negative.  Negative for dizziness.    Allergies  Review of patient's allergies indicates no known allergies.  Home Medications   Prior to Admission medications   Medication Sig Start Date End Date Taking? Authorizing Provider  amphetamine-dextroamphetamine (ADDERALL XR) 30 MG 24 hr capsule Take 30 mg by mouth daily.   Yes Historical Provider, MD  citalopram (CELEXA) 10 MG tablet Take 10 mg by mouth daily.   Yes Historical Provider, MD  etonogestrel-ethinyl estradiol (NUVARING) 0.12-0.015 MG/24HR vaginal ring Place 1 each vaginally every 28 (twenty-eight) days. Insert vaginally and leave in place for 3 consecutive weeks, then remove for 1 week.   Yes Historical Provider, MD  acetaminophen (TYLENOL) 500 MG tablet Take 500 mg by mouth every 6 (six) hours as needed. For pain    Historical Provider, MD  amoxicillin (AMOXIL) 500 MG capsule Take 2  capsules (1,000 mg total) by mouth 2 (two) times daily. 03/24/14   Janne Napoleon, NP  ASPIRIN PO Take 2 tablets by mouth daily as needed. For headache    Historical Provider, MD  clarithromycin (BIAXIN) 500 MG tablet Take 1 tablet (500 mg total) by mouth 2 (two) times daily. X 12 days 03/24/14   Janne Napoleon, NP  Ketorolac Tromethamine (TORADOL ORAL PO) Take 1 each by mouth once.    Historical Provider, MD  Multiple Vitamin (MULTIVITAMIN WITH MINERALS) TABS  Take 1 tablet by mouth daily.    Historical Provider, MD  omeprazole (PRILOSEC) 20 MG capsule Take 1 capsule (20 mg total) by mouth daily. 03/24/14   Janne Napoleon, NP   BP 130/82  Pulse 97  Temp(Src) 98.3 F (36.8 C) (Oral)  Resp 16  SpO2 96% Physical Exam  Nursing note and vitals reviewed. Constitutional: She is oriented to person, place, and time. She appears well-developed and well-nourished. No distress.  HENT:  Head: Normocephalic and atraumatic.  Eyes: Conjunctivae are normal.  Neck: Neck supple.  Cardiovascular: Normal rate and regular rhythm.   Pulmonary/Chest: Effort normal and breath sounds normal.    Neurological: She is alert and oriented to person, place, and time.  Skin: Skin is warm and dry.  Psychiatric: She has a normal mood and affect.    ED Course  Procedures    Date: 05/10/2014  Rate: 91  Rhythm: NSR  QRS Axis: Normal  Intervals: Normal  ST/T Wave abnormalities: None  Conduction Disutrbances: None  Old EKG Reviewed: No old EKG on record.   Labs Review Labs Reviewed - No data to display  Imaging Review No results found.   MDM   1. Chest pain, radiating    Radiating (L) sided CP since this am. EKG normal. Strongly suspect musculoskeletal in nature but will send to ED at [facility] for further evaluation. Discussed pt with Dr Maricela Bo who is in agreement w/ plan. Pt is agreeable to go to [facility] ED for further work-up.     Jeryl Columbia, NP 05/10/14 [place], NP 05/10/14 1319

## 2014-05-13 NOTE — ED Provider Notes (Signed)
Medical screening examination/treatment/procedure(s) were performed by non-physician practitioner and as supervising physician I was immediately available for consultation/collaboration.  Philipp Deputy, M.D.  Harden Mo, MD 05/13/14 [phone]

## 2015-02-06 ENCOUNTER — Encounter (HOSPITAL_COMMUNITY): Payer: Self-pay

## 2015-02-06 ENCOUNTER — Emergency Department (HOSPITAL_COMMUNITY)
Admission: EM | Admit: 2015-02-06 | Discharge: 2015-02-06 | Disposition: A | Discharge status: Home / Self Care | Payer: 59 | Source: Home / Self Care | Attending: Emergency Medicine | Admitting: Emergency Medicine

## 2015-02-06 DIAGNOSIS — K2970 Gastritis, unspecified, without bleeding: Secondary | ICD-10-CM

## 2015-02-06 HISTORY — DX: Attention-deficit hyperactivity disorder, unspecified type: F90.9

## 2015-02-06 MED ORDER — OMEPRAZOLE 20 MG PO CPDR: 20.0000 mg | DELAYED_RELEASE_CAPSULE | Freq: Every day | ORAL | Status: DC

## 2015-02-06 MED ORDER — TETRACYCLINE HCL 500 MG PO CAPS: 500.0000 mg | ORAL_CAPSULE | Freq: Four times a day (QID) | ORAL | Status: DC

## 2015-02-06 MED ORDER — METRONIDAZOLE 250 MG PO TABS: 250.0000 mg | ORAL_TABLET | Freq: Four times a day (QID) | ORAL | Status: DC

## 2015-02-06 MED ORDER — GI COCKTAIL ~~LOC~~
ORAL | Status: AC
  Filled 2015-02-06: qty 30

## 2015-02-06 MED ORDER — SUCRALFATE 1 G PO TABS: 1.0000 g | ORAL_TABLET | Freq: Three times a day (TID) | ORAL | Status: DC

## 2015-02-06 MED ORDER — GI COCKTAIL ~~LOC~~
30.0000 mL | Freq: Once | ORAL | Status: AC
  Administered 2015-02-06: 30 mL via ORAL

## 2015-02-06 NOTE — ED Notes (Signed)
Stated history of H Pylori dx last year, having recurrent upper abdominal pain much like her previous episode. Minimal relief w omeprazole

## 2015-02-06 NOTE — ED Provider Notes (Addendum)
CSN: [id number]     Arrival date & time 02/06/15  1008 History   First MD Initiated Contact with Patient 02/06/15 1031     Chief Complaint  Patient presents with  . GI Problem   (Consider location/radiation/quality/duration/timing/severity/associated sxs/prior Treatment) HPI  She is a 34 year old woman here for evaluation of epigastric pain. She states this started on Tuesday. She reports a burning sensation in the epigastric. It comes and goes, but does not completely resolve. She denies any vomiting, diarrhea. She does report some nausea. She is able to tolerate liquids well, but has no appetite for food. She has tried Pepto-Bismol without improvement. She states last May she was treated for H. Pylori and her symptoms are similar today.  Past Medical History  Diagnosis Date  . Adult ADHD    History reviewed. No pertinent past surgical history. History reviewed. No pertinent family history. History  Substance Use Topics  . Smoking status: Never Smoker   . Smokeless tobacco: Not on file  . Alcohol Use: No   OB History    No data available     Review of Systems  Constitutional: Positive for appetite change. Negative for fever and chills.  Gastrointestinal: Positive for nausea and abdominal pain. Negative for vomiting and diarrhea.    Allergies  Review of patient's allergies indicates no known allergies.  Home Medications   Prior to Admission medications   Medication Sig Start Date End Date Taking? Authorizing Provider  amphetamine-dextroamphetamine (ADDERALL) 30 MG tablet Take 30 mg by mouth daily.   Yes Historical Provider, MD  CALCIUM-VITAMIN D PO Take 1 tablet by mouth daily.    Historical Provider, MD  etonogestrel-ethinyl estradiol (NUVARING) 0.12-0.015 MG/24HR vaginal ring Place 1 each vaginally every 28 (twenty-eight) days. Insert vaginally and leave in place for 3 consecutive weeks, then remove for 1 week.    Historical Provider, MD  ibuprofen (ADVIL,MOTRIN) 800 MG  tablet Take 1 tablet (800 mg total) by mouth 3 (three) times daily. 05/10/14   Ezequiel Essex, MD  loratadine (CLARITIN) 10 MG tablet Take 10 mg by mouth daily.    Historical Provider, MD  metroNIDAZOLE (FLAGYL) 250 MG tablet Take 1 tablet (250 mg total) by mouth 4 (four) times daily. 02/06/15   Melony Overly, MD  omeprazole (PRILOSEC) 20 MG capsule Take 1 capsule (20 mg total) by mouth daily. 02/06/15   Melony Overly, MD  sucralfate (CARAFATE) 1 G tablet Take 1 tablet (1 g total) by mouth 4 (four) times daily -  with meals and at bedtime. 02/06/15   Melony Overly, MD  tetracycline (ACHROMYCIN,SUMYCIN) 500 MG capsule Take 1 capsule (500 mg total) by mouth 4 (four) times daily. 02/06/15   Melony Overly, MD   BP 114/84 mmHg  Pulse 83  Temp(Src) 97 F (36.1 C) (Oral)  Resp 12  SpO2 97% Physical Exam  Constitutional: She is oriented to person, place, and time. She appears well-developed and well-nourished. No distress.  Cardiovascular: Normal rate.   Pulmonary/Chest: Effort normal.  Abdominal: Soft. Bowel sounds are normal. She exhibits no distension. There is tenderness (in epigastric). There is no rebound and no guarding.  Neurological: She is alert and oriented to person, place, and time.    ED Course  Procedures (including critical care time) Labs Review Labs Reviewed - No data to display  Imaging Review No results found.   MDM   1. Gastritis    GI cocktail given. Patient reports resolution of her pain after GI cocktail.  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 Was treated previously with amoxicillin, clarithromycin, omeprazole. Will treat with tetracycline, metronidazole, omeprazole, and Carafate. Return precautions reviewed as in after visit summary.    Melony Overly, MD 02/06/15 [place], MD 02/06/15 [phone]

## 2015-02-06 NOTE — Discharge Instructions (Signed)
We are going to treat you for recurrent H. Pylori. Take omeprazole daily for the next 2 weeks. Take the tetracycline, metronidazole, sucralfate 4 times a day for the next 2 weeks. Follow-up if you continue to have stomach pain. If your pain suddenly gets worse or you start vomiting, please go to the emergency room.

## 2015-07-17 IMAGING — CR DG CHEST 2V
2 series · 2 of 2 positions shown · non-contrast
Comparison: None available

CLINICAL DATA: CHEST PAIN

EXAM:
CHEST - 2 VIEW

[w chest pa]
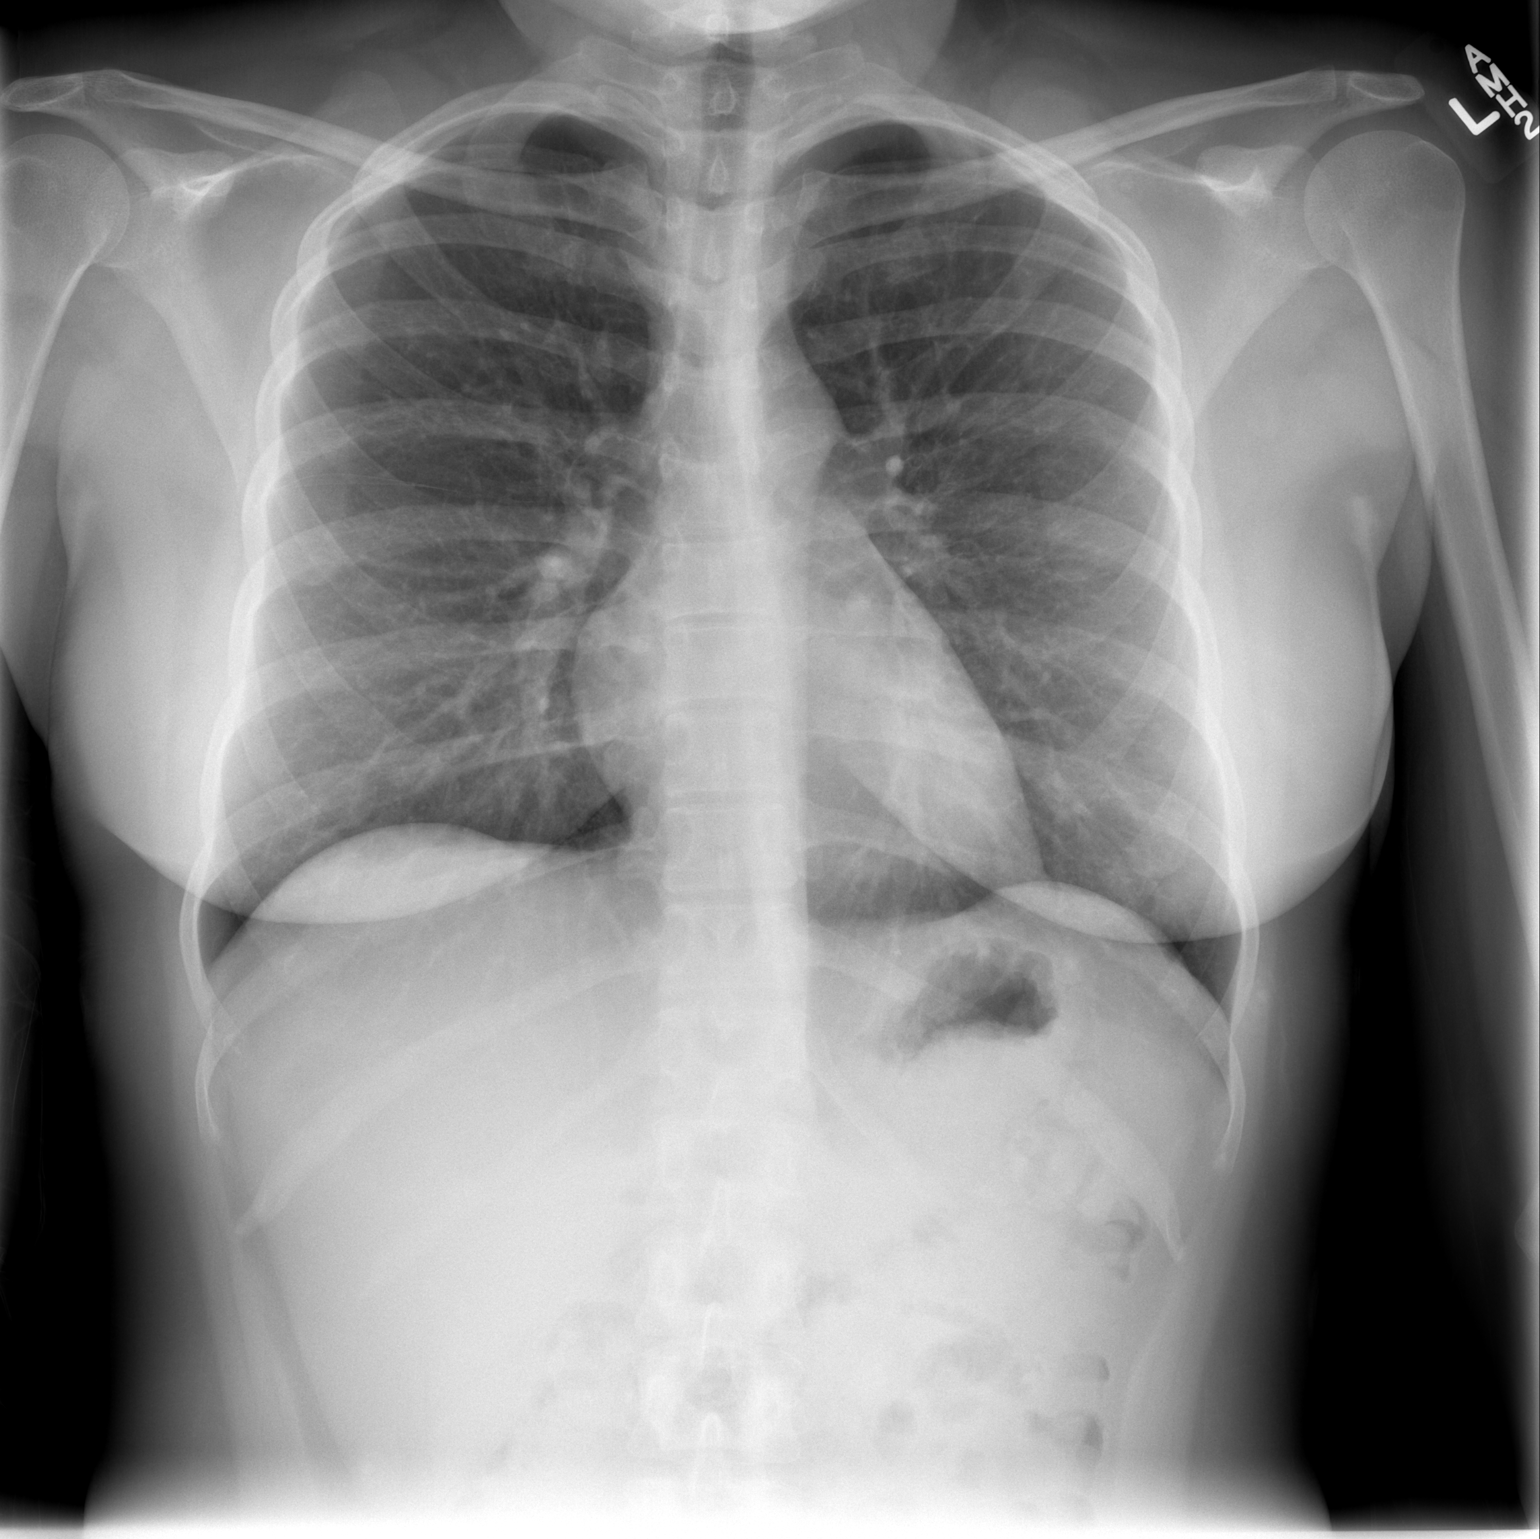

[w chest lat]
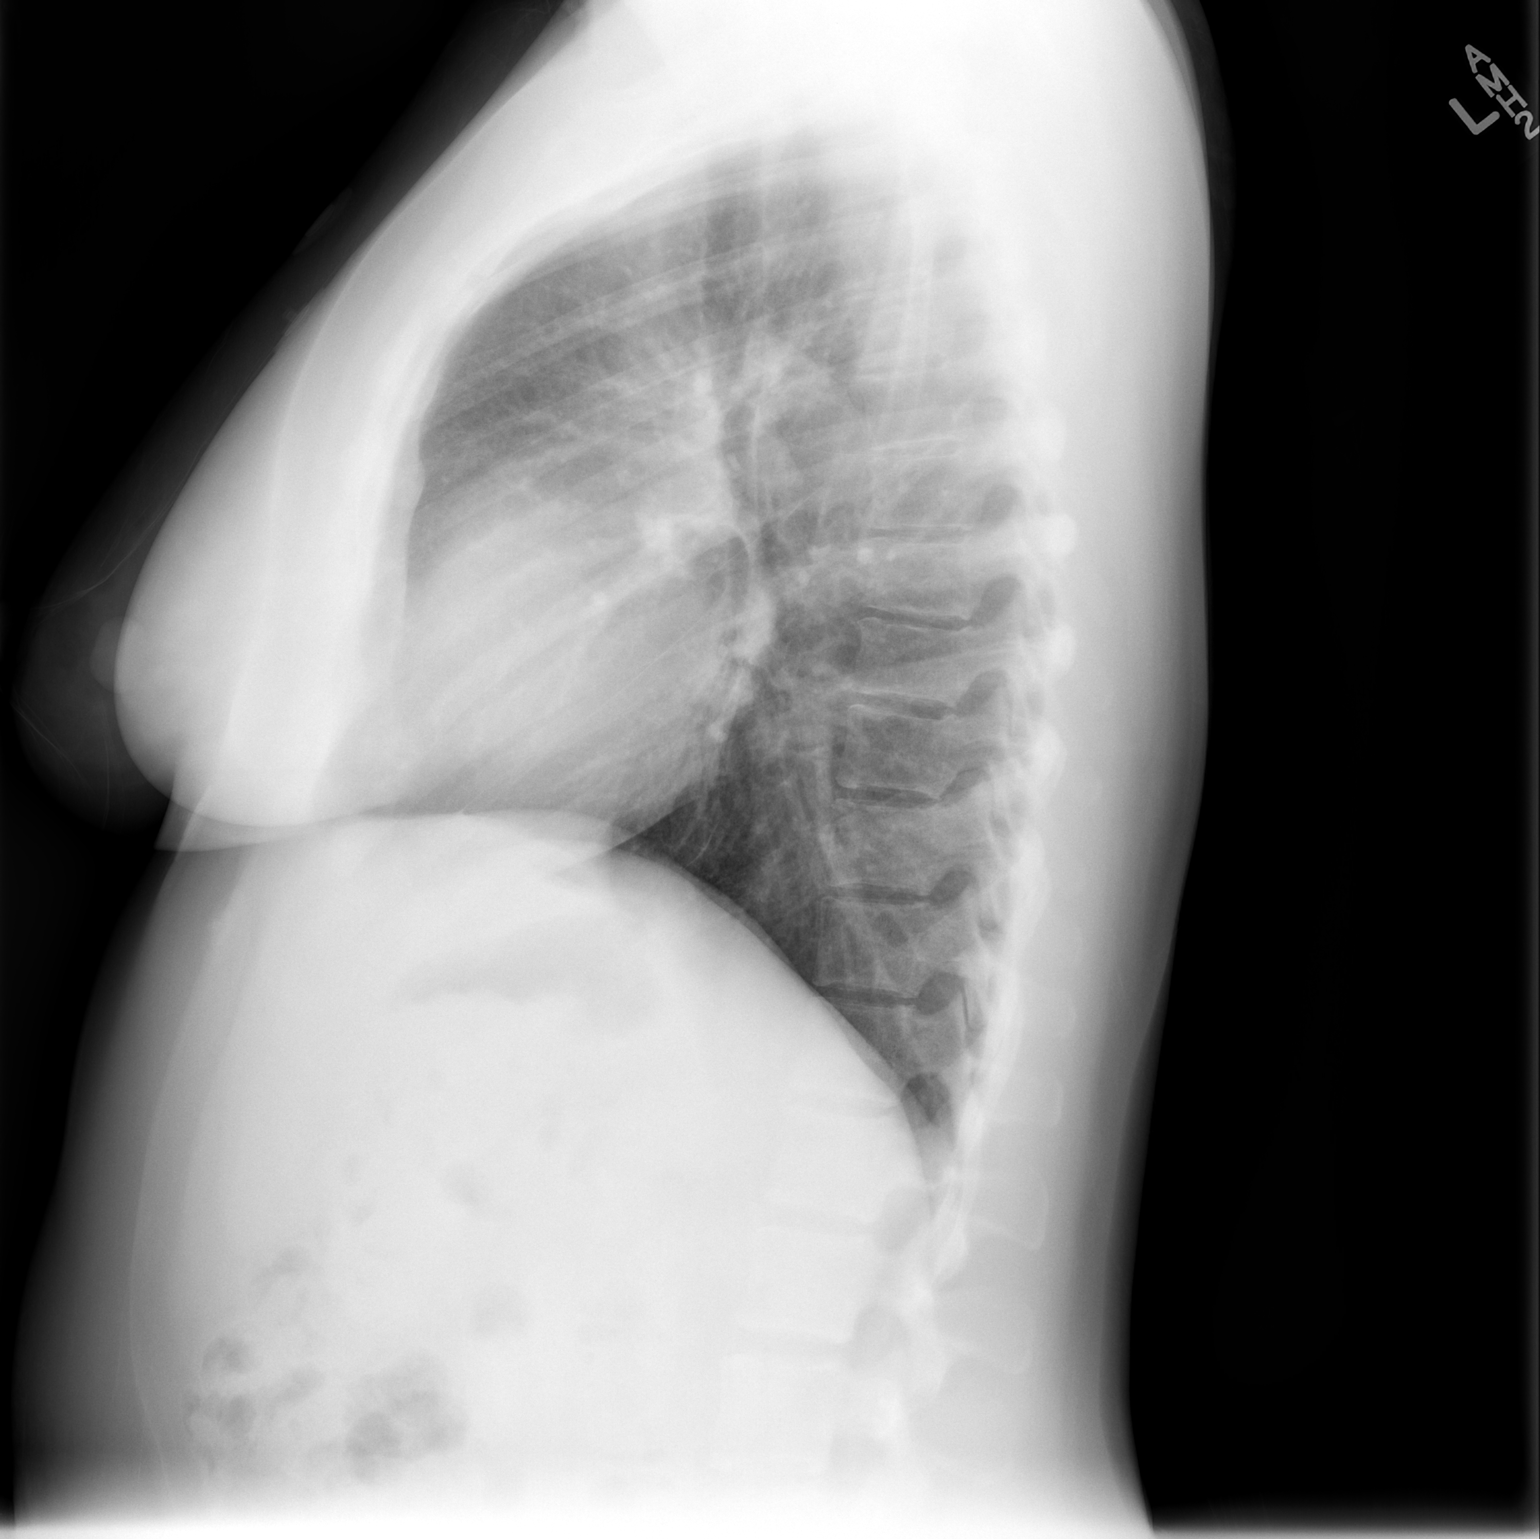

[2 of 2 positions shown; findings below may reference images not displayed]

FINDINGS: Lungs clear.  Heart size normal.  No   pneumothorax.
No effusion.
Visualized skeletal structures are unremarkable.
IMPRESSION: No acute cardiopulmonary disease.

## 2015-11-16 DIAGNOSIS — M9903 Segmental and somatic dysfunction of lumbar region: Secondary | ICD-10-CM | POA: Diagnosis not present

## 2015-11-16 DIAGNOSIS — M9902 Segmental and somatic dysfunction of thoracic region: Secondary | ICD-10-CM | POA: Diagnosis not present

## 2015-11-16 DIAGNOSIS — M5386 Other specified dorsopathies, lumbar region: Secondary | ICD-10-CM | POA: Diagnosis not present

## 2015-11-16 DIAGNOSIS — M9905 Segmental and somatic dysfunction of pelvic region: Secondary | ICD-10-CM | POA: Diagnosis not present

## 2015-11-18 DIAGNOSIS — M9905 Segmental and somatic dysfunction of pelvic region: Secondary | ICD-10-CM | POA: Diagnosis not present

## 2015-11-18 DIAGNOSIS — M9902 Segmental and somatic dysfunction of thoracic region: Secondary | ICD-10-CM | POA: Diagnosis not present

## 2015-11-18 DIAGNOSIS — M9903 Segmental and somatic dysfunction of lumbar region: Secondary | ICD-10-CM | POA: Diagnosis not present

## 2015-11-18 DIAGNOSIS — M5386 Other specified dorsopathies, lumbar region: Secondary | ICD-10-CM | POA: Diagnosis not present

## 2015-11-23 DIAGNOSIS — M9903 Segmental and somatic dysfunction of lumbar region: Secondary | ICD-10-CM | POA: Diagnosis not present

## 2015-11-23 DIAGNOSIS — M9905 Segmental and somatic dysfunction of pelvic region: Secondary | ICD-10-CM | POA: Diagnosis not present

## 2015-11-23 DIAGNOSIS — M9902 Segmental and somatic dysfunction of thoracic region: Secondary | ICD-10-CM | POA: Diagnosis not present

## 2015-11-23 DIAGNOSIS — M5386 Other specified dorsopathies, lumbar region: Secondary | ICD-10-CM | POA: Diagnosis not present

## 2015-11-25 DIAGNOSIS — M5386 Other specified dorsopathies, lumbar region: Secondary | ICD-10-CM | POA: Diagnosis not present

## 2015-11-25 DIAGNOSIS — M9902 Segmental and somatic dysfunction of thoracic region: Secondary | ICD-10-CM | POA: Diagnosis not present

## 2015-11-25 DIAGNOSIS — M9905 Segmental and somatic dysfunction of pelvic region: Secondary | ICD-10-CM | POA: Diagnosis not present

## 2015-11-25 DIAGNOSIS — M9903 Segmental and somatic dysfunction of lumbar region: Secondary | ICD-10-CM | POA: Diagnosis not present

## 2015-11-30 DIAGNOSIS — M9902 Segmental and somatic dysfunction of thoracic region: Secondary | ICD-10-CM | POA: Diagnosis not present

## 2015-11-30 DIAGNOSIS — M9905 Segmental and somatic dysfunction of pelvic region: Secondary | ICD-10-CM | POA: Diagnosis not present

## 2015-11-30 DIAGNOSIS — M5386 Other specified dorsopathies, lumbar region: Secondary | ICD-10-CM | POA: Diagnosis not present

## 2015-11-30 DIAGNOSIS — M9903 Segmental and somatic dysfunction of lumbar region: Secondary | ICD-10-CM | POA: Diagnosis not present

## 2015-12-02 DIAGNOSIS — M9902 Segmental and somatic dysfunction of thoracic region: Secondary | ICD-10-CM | POA: Diagnosis not present

## 2015-12-02 DIAGNOSIS — M5386 Other specified dorsopathies, lumbar region: Secondary | ICD-10-CM | POA: Diagnosis not present

## 2015-12-02 DIAGNOSIS — M9905 Segmental and somatic dysfunction of pelvic region: Secondary | ICD-10-CM | POA: Diagnosis not present

## 2015-12-02 DIAGNOSIS — M9903 Segmental and somatic dysfunction of lumbar region: Secondary | ICD-10-CM | POA: Diagnosis not present

## 2015-12-07 DIAGNOSIS — M9905 Segmental and somatic dysfunction of pelvic region: Secondary | ICD-10-CM | POA: Diagnosis not present

## 2015-12-07 DIAGNOSIS — M9903 Segmental and somatic dysfunction of lumbar region: Secondary | ICD-10-CM | POA: Diagnosis not present

## 2015-12-07 DIAGNOSIS — M9902 Segmental and somatic dysfunction of thoracic region: Secondary | ICD-10-CM | POA: Diagnosis not present

## 2015-12-07 DIAGNOSIS — M5386 Other specified dorsopathies, lumbar region: Secondary | ICD-10-CM | POA: Diagnosis not present

## 2015-12-14 DIAGNOSIS — M9903 Segmental and somatic dysfunction of lumbar region: Secondary | ICD-10-CM | POA: Diagnosis not present

## 2015-12-14 DIAGNOSIS — M9902 Segmental and somatic dysfunction of thoracic region: Secondary | ICD-10-CM | POA: Diagnosis not present

## 2015-12-14 DIAGNOSIS — M9905 Segmental and somatic dysfunction of pelvic region: Secondary | ICD-10-CM | POA: Diagnosis not present

## 2015-12-14 DIAGNOSIS — M5386 Other specified dorsopathies, lumbar region: Secondary | ICD-10-CM | POA: Diagnosis not present

## 2015-12-31 DIAGNOSIS — Z0100 Encounter for examination of eyes and vision without abnormal findings: Secondary | ICD-10-CM | POA: Diagnosis not present

## 2015-12-31 MED FILL — SERTRALINE HCL 50 MG TABLET: 50 | 30 days supply | Qty: 30 | Fill #4

## 2015-12-31 MED FILL — NUVARING VAGINAL RING: 0.12-0.015 | 84 days supply | Qty: 3 | Fill #1

## 2016-01-06 DIAGNOSIS — D251 Intramural leiomyoma of uterus: Secondary | ICD-10-CM | POA: Insufficient documentation

## 2016-01-06 DIAGNOSIS — D259 Leiomyoma of uterus, unspecified: Secondary | ICD-10-CM | POA: Diagnosis not present

## 2016-02-09 DIAGNOSIS — R102 Pelvic and perineal pain: Secondary | ICD-10-CM | POA: Diagnosis not present

## 2016-02-09 DIAGNOSIS — D251 Intramural leiomyoma of uterus: Secondary | ICD-10-CM | POA: Diagnosis not present

## 2016-02-09 DIAGNOSIS — D259 Leiomyoma of uterus, unspecified: Secondary | ICD-10-CM | POA: Diagnosis not present

## 2016-02-09 DIAGNOSIS — B029 Zoster without complications: Secondary | ICD-10-CM | POA: Diagnosis not present

## 2016-02-09 MED FILL — valACYclovir HCL 1 GM TABS: 1 | 10 days supply | Qty: 30 | Fill #0

## 2016-02-21 MED FILL — SERTRALINE HCL 50 MG TABLET: 50 | 30 days supply | Qty: 30 | Fill #5

## 2016-04-19 DIAGNOSIS — F338 Other recurrent depressive disorders: Secondary | ICD-10-CM | POA: Diagnosis not present

## 2016-04-19 DIAGNOSIS — F411 Generalized anxiety disorder: Secondary | ICD-10-CM | POA: Diagnosis not present

## 2016-04-19 DIAGNOSIS — F1920 Other psychoactive substance dependence, uncomplicated: Secondary | ICD-10-CM | POA: Diagnosis not present

## 2016-04-19 DIAGNOSIS — Z79899 Other long term (current) drug therapy: Secondary | ICD-10-CM | POA: Diagnosis not present

## 2016-04-19 DIAGNOSIS — F902 Attention-deficit hyperactivity disorder, combined type: Secondary | ICD-10-CM | POA: Diagnosis not present

## 2016-04-19 MED FILL — NUVARING VAGINAL RING: 0.12-0.015 | 84 days supply | Qty: 3 | Fill #0

## 2016-04-19 MED FILL — DULoxetine HCL 30 MG CPEP: 30 | 15 days supply | Qty: 30 | Fill #0

## 2016-04-19 MED FILL — DEPLIN-ALGAL OIL 15 MG CAP: 15-90.314 | 30 days supply | Qty: 30 | Fill #0

## 2016-05-10 DIAGNOSIS — K219 Gastro-esophageal reflux disease without esophagitis: Secondary | ICD-10-CM | POA: Insufficient documentation

## 2016-05-10 MED FILL — OMEPRAZOLE DR 20 MG CAPSULE: 20 | 30 days supply | Qty: 30 | Fill #0

## 2016-05-11 MED FILL — DULoxetine HCL 60 MG CPEP: 60 | 30 days supply | Qty: 30 | Fill #0

## 2016-05-19 MED FILL — DEPLIN-ALGAL OIL 15 MG CAP: 15-90.314 | 30 days supply | Qty: 30 | Fill #1

## 2016-05-31 DIAGNOSIS — F338 Other recurrent depressive disorders: Secondary | ICD-10-CM | POA: Diagnosis not present

## 2016-05-31 DIAGNOSIS — R41840 Attention and concentration deficit: Secondary | ICD-10-CM | POA: Diagnosis not present

## 2016-05-31 DIAGNOSIS — F411 Generalized anxiety disorder: Secondary | ICD-10-CM | POA: Diagnosis not present

## 2016-06-13 MED FILL — DULoxetine HCL 60 MG CPEP: 60 | 30 days supply | Qty: 30 | Fill #1

## 2016-06-23 MED FILL — DEPLIN-ALGAL OIL 15 MG CAP: 15-90.314 | 30 days supply | Qty: 30 | Fill #2

## 2016-07-13 MED FILL — DULoxetine HCL 60 MG CPEP: 60 | 30 days supply | Qty: 30 | Fill #2

## 2016-07-27 MED FILL — DEPLIN-ALGAL OIL 15 MG CAP: 15-90.314 | 30 days supply | Qty: 30 | Fill #3

## 2016-08-11 MED FILL — DULoxetine HCL 60 MG CPEP: 60 | 30 days supply | Qty: 30 | Fill #3

## 2016-08-30 DIAGNOSIS — Z3200 Encounter for pregnancy test, result unknown: Secondary | ICD-10-CM | POA: Diagnosis not present

## 2016-08-30 DIAGNOSIS — N911 Secondary amenorrhea: Secondary | ICD-10-CM | POA: Diagnosis not present

## 2016-08-30 DIAGNOSIS — O020 Blighted ovum and nonhydatidiform mole: Secondary | ICD-10-CM | POA: Diagnosis not present

## 2016-08-30 DIAGNOSIS — O200 Threatened abortion: Secondary | ICD-10-CM | POA: Diagnosis not present

## 2016-08-30 MED FILL — DEPLIN-ALGAL OIL 15 MG CAP: 15-90.314 | 30 days supply | Qty: 30 | Fill #4

## 2016-09-05 DIAGNOSIS — N911 Secondary amenorrhea: Secondary | ICD-10-CM | POA: Diagnosis not present

## 2016-09-12 DIAGNOSIS — Z130 Encounter for screening for diseases of the blood and blood-forming organs and certain disorders involving the immune mechanism: Secondary | ICD-10-CM | POA: Diagnosis not present

## 2016-09-12 DIAGNOSIS — Z13228 Encounter for screening for other metabolic disorders: Secondary | ICD-10-CM | POA: Diagnosis not present

## 2016-09-12 DIAGNOSIS — Z3481 Encounter for supervision of other normal pregnancy, first trimester: Secondary | ICD-10-CM | POA: Diagnosis not present

## 2016-09-12 MED FILL — DULoxetine HCL 60 MG CPEP: 60 | 30 days supply | Qty: 30 | Fill #4

## 2016-09-26 DIAGNOSIS — Z3A09 9 weeks gestation of pregnancy: Secondary | ICD-10-CM | POA: Diagnosis not present

## 2016-09-26 DIAGNOSIS — Z113 Encounter for screening for infections with a predominantly sexual mode of transmission: Secondary | ICD-10-CM | POA: Diagnosis not present

## 2016-09-26 DIAGNOSIS — Z3480 Encounter for supervision of other normal pregnancy, unspecified trimester: Secondary | ICD-10-CM | POA: Diagnosis not present

## 2016-09-26 DIAGNOSIS — Z3401 Encounter for supervision of normal first pregnancy, first trimester: Secondary | ICD-10-CM | POA: Diagnosis not present

## 2016-10-02 MED FILL — DEPLIN-ALGAL OIL 15 MG CAP: 15-90.314 | 30 days supply | Qty: 30 | Fill #5

## 2016-10-16 DIAGNOSIS — Z3A12 12 weeks gestation of pregnancy: Secondary | ICD-10-CM | POA: Diagnosis not present

## 2016-10-16 DIAGNOSIS — O09521 Supervision of elderly multigravida, first trimester: Secondary | ICD-10-CM | POA: Diagnosis not present

## 2016-10-16 DIAGNOSIS — Z3682 Encounter for antenatal screening for nuchal translucency: Secondary | ICD-10-CM | POA: Diagnosis not present

## 2016-10-17 MED FILL — DULoxetine HCL 60 MG CPEP: 60 | 30 days supply | Qty: 30 | Fill #0

## 2016-10-23 MED FILL — DICLEGIS DR 10-10 MG TABLET: 10-10 | 30 days supply | Qty: 120 | Fill #0

## 2016-10-31 MED FILL — NITROFURANTOIN MONO-MCR 100: 100 | 7 days supply | Qty: 14 | Fill #0

## 2016-11-09 MED FILL — DEPLIN-ALGAL OIL 15 MG CAP: 15-90.314 | 30 days supply | Qty: 30 | Fill #6

## 2016-11-13 NOTE — L&D Delivery Note (Signed)
Operative Delivery Note At 9:37 AM a viable female was delivered via Vaginal, Vacuum Neurosurgeon).  Decelerations noted to nadir of 70 bpm after each contraction with slowed recovery.  Decision was made to use Kiwi vacuum to expedite delivery.  Presentation: vertex; Position: Right,, Occiput,, Anterior; Station: +3.  Verbal consent: obtained from patient.  Risks and benefits discussed in detail.  Risks include, but are not limited to the risks of anesthesia, bleeding, infection, damage to maternal tissues, fetal cephalhematoma.  There is also the risk of inability to effect vaginal delivery of the head, or shoulder dystocia that cannot be resolved by established maneuvers, leading to the need for emergency cesarean section.  APGAR: 9, 9; weight  .   Placenta status: Manual extraction, Intact. Cord: 3V with the following complications: loose nuchal x 1.  Cord pH: n/a  Anesthesia:  CLEA Instruments: Kiwi Episiotomy: None Lacerations: 2nd degree;Periurethral Suture Repair: 3.0 vicryl rapide Est. Blood Loss (mL):  300  Mom to postpartum.  Baby to Couplet care / Skin to Skin.  Olivia Pruitt, [place] 04/04/2017, 10:11 AM

## 2016-11-14 DIAGNOSIS — F419 Anxiety disorder, unspecified: Secondary | ICD-10-CM | POA: Diagnosis not present

## 2016-11-14 DIAGNOSIS — F988 Other specified behavioral and emotional disorders with onset usually occurring in childhood and adolescence: Secondary | ICD-10-CM | POA: Diagnosis not present

## 2016-11-14 DIAGNOSIS — Z0000 Encounter for general adult medical examination without abnormal findings: Secondary | ICD-10-CM | POA: Diagnosis not present

## 2016-11-14 DIAGNOSIS — R7301 Impaired fasting glucose: Secondary | ICD-10-CM | POA: Diagnosis not present

## 2016-11-23 DIAGNOSIS — Z3400 Encounter for supervision of normal first pregnancy, unspecified trimester: Secondary | ICD-10-CM | POA: Diagnosis not present

## 2016-11-23 DIAGNOSIS — Z3A18 18 weeks gestation of pregnancy: Secondary | ICD-10-CM | POA: Diagnosis not present

## 2016-11-23 DIAGNOSIS — Z3480 Encounter for supervision of other normal pregnancy, unspecified trimester: Secondary | ICD-10-CM | POA: Diagnosis not present

## 2016-11-23 DIAGNOSIS — Z363 Encounter for antenatal screening for malformations: Secondary | ICD-10-CM | POA: Diagnosis not present

## 2016-11-23 MED FILL — DULoxetine HCL 60 MG CPEP: 60 | 30 days supply | Qty: 30 | Fill #1

## 2016-12-01 DIAGNOSIS — Z79899 Other long term (current) drug therapy: Secondary | ICD-10-CM | POA: Diagnosis not present

## 2016-12-01 DIAGNOSIS — F902 Attention-deficit hyperactivity disorder, combined type: Secondary | ICD-10-CM | POA: Diagnosis not present

## 2016-12-01 DIAGNOSIS — F338 Other recurrent depressive disorders: Secondary | ICD-10-CM | POA: Diagnosis not present

## 2016-12-01 DIAGNOSIS — F411 Generalized anxiety disorder: Secondary | ICD-10-CM | POA: Diagnosis not present

## 2016-12-05 DIAGNOSIS — D72829 Elevated white blood cell count, unspecified: Secondary | ICD-10-CM | POA: Diagnosis not present

## 2016-12-05 DIAGNOSIS — R7989 Other specified abnormal findings of blood chemistry: Secondary | ICD-10-CM | POA: Diagnosis not present

## 2016-12-22 MED FILL — DICLEGIS DR 10-10 MG TABLET: 10-10 | 30 days supply | Qty: 120 | Fill #1

## 2016-12-22 MED FILL — DULoxetine HCL 60 MG CPEP: 60 | 30 days supply | Qty: 30 | Fill #2

## 2016-12-23 ENCOUNTER — Encounter (HOSPITAL_COMMUNITY): Payer: Self-pay

## 2016-12-23 ENCOUNTER — Inpatient Hospital Stay (HOSPITAL_COMMUNITY)
Admission: AD | Admit: 2016-12-23 | Discharge: 2016-12-23 | Disposition: A | Discharge status: Home / Self Care | Payer: 59 | Source: Ambulatory Visit | Attending: Obstetrics and Gynecology | Admitting: Obstetrics and Gynecology

## 2016-12-23 DIAGNOSIS — O99342 Other mental disorders complicating pregnancy, second trimester: Secondary | ICD-10-CM | POA: Insufficient documentation

## 2016-12-23 DIAGNOSIS — Z3A22 22 weeks gestation of pregnancy: Secondary | ICD-10-CM | POA: Diagnosis not present

## 2016-12-23 DIAGNOSIS — F329 Major depressive disorder, single episode, unspecified: Secondary | ICD-10-CM | POA: Diagnosis not present

## 2016-12-23 DIAGNOSIS — O99352 Diseases of the nervous system complicating pregnancy, second trimester: Secondary | ICD-10-CM | POA: Diagnosis not present

## 2016-12-23 DIAGNOSIS — F909 Attention-deficit hyperactivity disorder, unspecified type: Secondary | ICD-10-CM | POA: Diagnosis not present

## 2016-12-23 DIAGNOSIS — G43909 Migraine, unspecified, not intractable, without status migrainosus: Secondary | ICD-10-CM

## 2016-12-23 DIAGNOSIS — Z79899 Other long term (current) drug therapy: Secondary | ICD-10-CM | POA: Insufficient documentation

## 2016-12-23 DIAGNOSIS — R51 Headache: Secondary | ICD-10-CM | POA: Diagnosis present

## 2016-12-23 HISTORY — DX: Headache: R51

## 2016-12-23 HISTORY — DX: Depression, unspecified: F32.A

## 2016-12-23 HISTORY — DX: Headache, unspecified: R51.9

## 2016-12-23 HISTORY — DX: Major depressive disorder, single episode, unspecified: F32.9

## 2016-12-23 LAB — URINALYSIS, ROUTINE W REFLEX MICROSCOPIC
BILIRUBIN URINE: NEGATIVE
Glucose, UA: NEGATIVE mg/dL
Hgb urine dipstick: NEGATIVE
Ketones, ur: NEGATIVE mg/dL
Leukocytes, UA: NEGATIVE
NITRITE: NEGATIVE
PROTEIN: NEGATIVE mg/dL
SPECIFIC GRAVITY, URINE: 1.014 (ref 1.005–1.030)
pH: 7 (ref 5.0–8.0)

## 2016-12-23 MED ORDER — DIPHENHYDRAMINE HCL 50 MG/ML IJ SOLN
25.0000 mg | Freq: Once | INTRAMUSCULAR | Status: AC
  Administered 2016-12-23: 25 mg via INTRAVENOUS
  Filled 2016-12-23: qty 1

## 2016-12-23 MED ORDER — METOCLOPRAMIDE HCL 5 MG/ML IJ SOLN
10.0000 mg | Freq: Once | INTRAMUSCULAR | Status: AC
  Administered 2016-12-23: 10 mg via INTRAVENOUS
  Filled 2016-12-23: qty 2

## 2016-12-23 MED ORDER — LACTATED RINGERS IV SOLN
INTRAVENOUS | Status: DC
  Administered 2016-12-23: 04:00:00 via INTRAVENOUS

## 2016-12-23 MED ORDER — DEXAMETHASONE SODIUM PHOSPHATE 10 MG/ML IJ SOLN
10.0000 mg | Freq: Once | INTRAMUSCULAR | Status: AC
  Administered 2016-12-23: 10 mg via INTRAVENOUS
  Filled 2016-12-23: qty 1

## 2016-12-23 NOTE — MAU Provider Note (Signed)
Chief Complaint:  Headache   First Provider Initiated Contact with Patient 12/23/16 0343     HPI: Olivia Pruitt is a 36 y.o. G3P2002 at 52w3dwho presents to maternity admissions reporting headache since Thursday. Primarily left temple area.  States is just like a migraine from 5 yrs ago. States had the "cocktail" and it worked great, but she did not tolerate the caffeine in the Fioricet.   She reports good fetal movement, denies LOF, vaginal bleeding, vaginal itching/burning, urinary symptoms, h/a, dizziness, n/v, diarrhea, constipation or fever/chills.   Headache   This is a new problem. The current episode started in the past 7 days. The problem occurs constantly. The problem has been unchanged. The pain is located in the left unilateral and temporal region. The pain does not radiate. The pain quality is similar to prior headaches. The quality of the pain is described as aching and dull. The pain is severe. Associated symptoms include photophobia. Pertinent negatives include no abdominal pain, back pain, fever, nausea, seizures, sinus pressure, sore throat, swollen glands, tingling or vomiting. The symptoms are aggravated by bright light. She has tried acetaminophen for the symptoms. The treatment provided no relief.   RN Note: Left temple headache and left ear since Thursday, got worse Friday afternoon at 4 pm but only headache, no ear pain.  Saw Dr Gaetano Net at 1230 pm. Took Tylenol yesterday and this am didn't help.  No bleeding. Baby been moving well. No leaking.  Has had a migraine 5 years ago  Past Medical History: Past Medical History:  Diagnosis Date  . Adult ADHD   . Depression   . Headache     Past obstetric history: OB History  Gravida Para Term Preterm AB Living  3 2 2     2   SAB TAB Ectopic Multiple Live Births          2    # Outcome Date GA Lbr Len/2nd Weight Sex Delivery Anes PTL Lv  3 Current           2 Term      Vag-Spont   LIV  1 Term      Vag-Spont   LIV       Past Surgical History: Past Surgical History:  Procedure Laterality Date  . NO PAST SURGERIES      Family History: Family History  Problem Relation Age of Onset  . Hyperlipidemia Mother   . Hyperlipidemia Father     Social History: Social History  Substance Use Topics  . Smoking status: Never Smoker  . Smokeless tobacco: Never Used  . Alcohol use No    Allergies: No Known Allergies  Meds:  Prescriptions Prior to Admission  Medication Sig Dispense Refill Last Dose  . Doxylamine-Pyridoxine (DICLEGIS PO) Take by mouth.   Past Week at Unknown time  . DULoxetine (CYMBALTA) 60 MG capsule Take 60 mg by mouth daily.   12/22/2016 at Unknown time  . L-Methylfolate-Algae (DEPLIN 15 PO) Take by mouth.   12/22/2016 at Unknown time  . Prenatal Vit-Fe Fumarate-FA (PRENATAL MULTIVITAMIN) TABS tablet Take 1 tablet by mouth daily at 12 noon.   12/22/2016 at Unknown time  . amphetamine-dextroamphetamine (ADDERALL) 30 MG tablet Take 30 mg by mouth daily.     Marland Kitchen CALCIUM-VITAMIN D PO Take 1 tablet by mouth daily.   Unknown at Unknown time  . etonogestrel-ethinyl estradiol (NUVARING) 0.12-0.015 MG/24HR vaginal ring Place 1 each vaginally every 28 (twenty-eight) days. Insert vaginally and leave in place for 3  consecutive weeks, then remove for 1 week.   Unknown at Unknown time  . ibuprofen (ADVIL,MOTRIN) 800 MG tablet Take 1 tablet (800 mg total) by mouth 3 (three) times daily. 21 tablet 0 Unknown at Unknown time  . loratadine (CLARITIN) 10 MG tablet Take 10 mg by mouth daily.   Unknown at Unknown time  . metroNIDAZOLE (FLAGYL) 250 MG tablet Take 1 tablet (250 mg total) by mouth 4 (four) times daily. 56 tablet 0   . omeprazole (PRILOSEC) 20 MG capsule Take 1 capsule (20 mg total) by mouth daily. 30 capsule 0   . sucralfate (CARAFATE) 1 G tablet Take 1 tablet (1 g total) by mouth 4 (four) times daily -  with meals and at bedtime. 56 tablet 0   . tetracycline (ACHROMYCIN,SUMYCIN) 500 MG capsule Take 1  capsule (500 mg total) by mouth 4 (four) times daily. 56 capsule 0     I have reviewed patient's Past Medical Hx, Surgical Hx, Family Hx, Social Hx, medications and allergies.   ROS:  Review of Systems  Constitutional: Negative for fever.  HENT: Negative for sinus pressure and sore throat.   Eyes: Positive for photophobia.  Gastrointestinal: Negative for abdominal pain, nausea and vomiting.  Musculoskeletal: Negative for back pain.  Neurological: Positive for headaches. Negative for tingling and seizures.   Other systems negative  Physical Exam  Patient Vitals for the past 24 hrs:  BP Temp Temp src Pulse Resp SpO2 Weight  12/23/16 0531 113/66 97.9 F (36.6 C) Oral 104 16 - -  12/23/16 0342 115/72 98.2 F (36.8 C) Oral 88 16 99 % -  12/23/16 0333 - - - - - - 168 lb (76.2 kg)   Constitutional: Well-developed, well-nourished female in no acute distress.  Cardiovascular: normal rate and rhythm Respiratory: normal effort, clear to auscultation bilaterally GI: Abd soft, non-tender, gravid appropriate for gestational age.   No rebound or guarding. MS: Extremities nontender, no edema, normal ROM Neurologic: Alert and oriented x 4. Moves all extremities well. No gross neuro deficits. Speech appropriate.  No facial asymetry.   GU: Neg CVAT.  PELVIC EXAM: deferred  FHT:  145   Labs: Results for orders placed or performed during the hospital encounter of 12/23/16 (from the past 24 hour(s))  Urinalysis, Routine w reflex microscopic     Status: None   Collection Time: 12/23/16  3:25 AM  Result Value Ref Range   Color, Urine YELLOW YELLOW   APPearance CLEAR CLEAR   Specific Gravity, Urine 1.014 1.005 - 1.030   pH 7.0 5.0 - 8.0   Glucose, UA NEGATIVE NEGATIVE mg/dL   Hgb urine dipstick NEGATIVE NEGATIVE   Bilirubin Urine NEGATIVE NEGATIVE   Ketones, ur NEGATIVE NEGATIVE mg/dL   Protein, ur NEGATIVE NEGATIVE mg/dL   Nitrite NEGATIVE NEGATIVE   Leukocytes, UA NEGATIVE NEGATIVE       Imaging:  No results found.  MAU Course/MDM: I have ordered labs and reviewed results.  IV started with LR. Reglan/Benadryl/Decadron cocktail given. Patient had complete relief of headache with it but then developed some restless legs, likely akin to dystonic reaction, though there were no other muscular movements. Discussed with Pharmacist who recommended giving a second dose of Benadryl (total dosage of 50mg ), which did remedy the symptoms. Consult Dr Corinna Capra with presentation, exam findings and test results.  Treatments in MAU included migraine cocktail and subsequent dose of Benadryl.    Assessment: 1. Migraine without status migrainosus, not intractable, unspecified migraine type  Plan: Discharge home Offered Rx for recurrent headaches and she declines.   Preterm Labor precautions and fetal kick counts Follow up in Office for prenatal visits and recheck of status  Follow-up Information    LOWE,DAVID C, MD Follow up.   Specialty:  Obstetrics and Gynecology Contact information: [address], [place] [address] [phone]           Pt stable at time of discharge.  Hansel Feinstein CNM, MSN Certified Nurse-Midwife 12/23/2016 5:39 AM

## 2016-12-23 NOTE — MAU Note (Signed)
Left temple headache and left ear since Thursday, got worse Friday afternoon at 4 pm but only headache, no ear pain.  Saw Dr Gaetano Net at 1230 pm. Took Tylenol yesterday and this am didn't help.  No bleeding. Baby been moving well. No leaking.  Has had a migraine 5 years ago.

## 2016-12-23 NOTE — Discharge Instructions (Signed)

## 2017-01-03 ENCOUNTER — Inpatient Hospital Stay (HOSPITAL_COMMUNITY)
Admission: AD | Admit: 2017-01-03 | Discharge: 2017-01-03 | Disposition: A | Discharge status: Home / Self Care | Payer: 59 | Source: Ambulatory Visit | Attending: Obstetrics and Gynecology | Admitting: Obstetrics and Gynecology

## 2017-01-03 ENCOUNTER — Encounter (HOSPITAL_COMMUNITY): Payer: Self-pay | Admitting: *Deleted

## 2017-01-03 DIAGNOSIS — O210 Mild hyperemesis gravidarum: Secondary | ICD-10-CM | POA: Insufficient documentation

## 2017-01-03 DIAGNOSIS — Z3A24 24 weeks gestation of pregnancy: Secondary | ICD-10-CM | POA: Diagnosis not present

## 2017-01-03 DIAGNOSIS — O219 Vomiting of pregnancy, unspecified: Secondary | ICD-10-CM | POA: Diagnosis not present

## 2017-01-03 LAB — URINALYSIS, ROUTINE W REFLEX MICROSCOPIC
Bilirubin Urine: NEGATIVE
GLUCOSE, UA: NEGATIVE mg/dL
Hgb urine dipstick: NEGATIVE
LEUKOCYTES UA: NEGATIVE
NITRITE: NEGATIVE
PH: 6 (ref 5.0–8.0)
Protein, ur: NEGATIVE mg/dL
Specific Gravity, Urine: 1.025 (ref 1.005–1.030)

## 2017-01-03 MED ORDER — ONDANSETRON HCL 4 MG/2ML IJ SOLN
4.0000 mg | Freq: Once | INTRAMUSCULAR | Status: AC
  Administered 2017-01-03: 4 mg via INTRAVENOUS
  Filled 2017-01-03: qty 2

## 2017-01-03 MED ORDER — DEXTROSE 5 % IN LACTATED RINGERS IV BOLUS
1000.0000 mL | Freq: Once | INTRAVENOUS | Status: AC
  Administered 2017-01-03: 1000 mL via INTRAVENOUS

## 2017-01-03 MED ORDER — ONDANSETRON 4 MG PO TBDP: 4.0000 mg | ORAL_TABLET | Freq: Three times a day (TID) | ORAL | 1 refills | Status: DC | PRN

## 2017-01-03 MED ORDER — FAMOTIDINE IN NACL 20-0.9 MG/50ML-% IV SOLN
20.0000 mg | Freq: Once | INTRAVENOUS | Status: AC
  Administered 2017-01-03: 20 mg via INTRAVENOUS
  Filled 2017-01-03: qty 50

## 2017-01-03 MED ORDER — RANITIDINE HCL 150 MG PO CAPS: 150.0000 mg | ORAL_CAPSULE | Freq: Every day | ORAL | 1 refills | Status: DC

## 2017-01-03 MED ORDER — LACTATED RINGERS IV BOLUS (SEPSIS)
1000.0000 mL | Freq: Once | INTRAVENOUS | Status: AC
Start: 2017-01-03 — End: 2017-01-03
  Administered 2017-01-03: 1000 mL via INTRAVENOUS

## 2017-01-03 NOTE — MAU Note (Signed)
Pt reports she has had nausea and vomiting since the 8th week of pregnancy and the meds are not working

## 2017-01-03 NOTE — Discharge Instructions (Signed)
Nausea and Vomiting, Adult Introduction Feeling sick to your stomach (nausea) means that your stomach is upset or you feel like you have to throw up (vomit). Feeling more and more sick to your stomach can lead to throwing up. Throwing up happens when food and liquid from your stomach are thrown up and out the mouth. Throwing up can make you feel weak and cause you to get dehydrated. Dehydration can make you tired and thirsty, make you have a dry mouth, and make it so you pee (urinate) less often. Older adults and people with other diseases or a weak defense system (immune system) are at higher risk for dehydration. If you feel sick to your stomach or if you throw up, it is important to follow instructions from your doctor about how to take care of yourself. Follow these instructions at home: Eating and drinking Follow these instructions as told by your doctor:  Take an oral rehydration solution (ORS). This is a drink that is sold at pharmacies and stores.  Drink clear fluids in small amounts as you are able, such as:  Water.  Ice chips.  Diluted fruit juice.  Low-calorie sports drinks.  Eat bland, easy-to-digest foods in small amounts as you are able, such as:  Bananas.  Applesauce.  Rice.  Low-fat (lean) meats.  Toast.  Crackers.  Avoid fluids that have a lot of sugar or caffeine in them.  Avoid alcohol.  Avoid spicy or fatty foods. General instructions  Drink enough fluid to keep your pee (urine) clear or pale yellow.  Wash your hands often. If you cannot use soap and water, use hand sanitizer.  Make sure that all people in your home wash their hands well and often.  Take over-the-counter and prescription medicines only as told by your doctor.  Rest at home while you get better.  Watch your condition for any changes.  Breathe slowly and deeply when you feel sick to your stomach.  Keep all follow-up visits as told by your doctor. This is important. Contact a  doctor if:  You have a fever.  You cannot keep fluids down.  Your symptoms get worse.  You have new symptoms.  You feel sick to your stomach for more than two days.  You feel light-headed or dizzy.  You have a headache.  You have muscle cramps. Get help right away if:  You have pain in your chest, neck, arm, or jaw.  You feel very weak or you pass out (faint).  You throw up again and again.  You see blood in your throw-up.  Your throw-up looks like black coffee grounds.  You have bloody or black poop (stools) or poop that look like tar.  You have a very bad headache, a stiff neck, or both.  You have a rash.  You have very bad pain, cramping, or bloating in your belly (abdomen).  You have trouble breathing.  You are breathing very quickly.  Your heart is beating very quickly.  Your skin feels cold and clammy.  You feel confused.  You have pain when you pee.  You have signs of dehydration, such as:  Dark pee, hardly any pee, or no pee.  Cracked lips.  Dry mouth.  Sunken eyes.  Sleepiness.  Weakness. These symptoms may be an emergency. Do not wait to see if the symptoms will go away. Get medical help right away. Call your local emergency services (911 in the [place] Do not drive yourself to the hospital.  This information   is not intended to replace advice given to you by your health care provider. Make sure you discuss any questions you have with your health care provider. Document Released: 04/17/2008 Document Revised: 05/19/2016 Document Reviewed: 07/06/2015  2017 [employer]vier  

## 2017-01-03 NOTE — MAU Provider Note (Signed)
History     CSN: [id number]  Arrival date and time: 01/03/17 1522   First Provider Initiated Contact with Patient 01/03/17 1557      No chief complaint on file.  HPI   [name] is a 36 y.o. female G3P2002 @ [redacted]w[redacted]d here in MAU with N/V.  She has tried eating today however each time she tried to eat or drink she vomited. The N/V has been going on since the 8th week of pregnancy. She has been taking Declegis 3 x per day however she feels now it is not working.   OB History    Gravida Para Term Preterm AB Living   3 2 2     2    SAB TAB Ectopic Multiple Live Births           2      Past Medical History:  Diagnosis Date  . Adult ADHD   . Depression   . Headache     Past Surgical History:  Procedure Laterality Date  . NO PAST SURGERIES      Family History  Problem Relation Age of Onset  . Hyperlipidemia Mother   . Hyperlipidemia Father     Social History  Substance Use Topics  . Smoking status: Never Smoker  . Smokeless tobacco: Never Used  . Alcohol use No    Allergies:  Allergies  Allergen Reactions  . Reglan [Metoclopramide] Other (See Comments)    Acute dystonia  . Codeine Rash    Prescriptions Prior to Admission  Medication Sig Dispense Refill Last Dose  . Doxylamine-Pyridoxine (DICLEGIS PO) Take 1-2 tablets by mouth 2 (two) times daily. Take 1 tablet by mouth in the morning and then take two tablets at bedtime.   01/03/2017 at Unknown time  . DULoxetine (CYMBALTA) 60 MG capsule Take 60 mg by mouth every morning.    01/03/2017 at Unknown time  . L-Methylfolate-Algae (DEPLIN 15 PO) Take 1 tablet by mouth every morning.    01/03/2017 at Unknown time  . Prenatal Vit-Fe Fumarate-FA (PRENATAL MULTIVITAMIN) TABS tablet Take 1 tablet by mouth daily at 12 noon.   Past Week at Unknown time  . omeprazole (PRILOSEC) 20 MG capsule Take 1 capsule (20 mg total) by mouth daily. (Patient not taking: Reported on 01/03/2017) 30 capsule 0 Not Taking at Unknown time  .  sucralfate (CARAFATE) 1 G tablet Take 1 tablet (1 g total) by mouth 4 (four) times daily -  with meals and at bedtime. (Patient not taking: Reported on 01/03/2017) 56 tablet 0 Not Taking at Unknown time  . tetracycline (ACHROMYCIN,SUMYCIN) 500 MG capsule Take 1 capsule (500 mg total) by mouth 4 (four) times daily. (Patient not taking: Reported on 01/03/2017) 56 capsule 0 Not Taking at Unknown time   Results for orders placed or performed during the hospital encounter of 01/03/17 (from the past 48 hour(s))  Urinalysis, Routine w reflex microscopic     Status: Abnormal   Collection Time: 01/03/17  3:30 PM  Result Value Ref Range   Color, Urine YELLOW YELLOW   APPearance CLEAR CLEAR   Specific Gravity, Urine 1.025 1.005 - 1.030   pH 6.0 5.0 - 8.0   Glucose, UA NEGATIVE NEGATIVE mg/dL   Hgb urine dipstick NEGATIVE NEGATIVE   Bilirubin Urine NEGATIVE NEGATIVE   Ketones, ur >80 (A) NEGATIVE mg/dL   Protein, ur NEGATIVE NEGATIVE mg/dL   Nitrite NEGATIVE NEGATIVE   Leukocytes, UA NEGATIVE NEGATIVE    Comment: Microscopic not done on urines  with negative protein, blood, leukocytes, nitrite, or glucose < 500 mg/dL.   Review of Systems  Gastrointestinal: Positive for abdominal pain (Upper abdominal pain only with vomiting ).  Neurological: Negative for dizziness.   Physical Exam   Blood pressure 120/68, pulse 102, temperature 98.1 F (36.7 C), temperature source Oral, resp. rate 17, SpO2 98 %.  Physical Exam  Constitutional: She is oriented to person, place, and time. She appears well-developed and well-nourished. No distress.  HENT:  Head: Normocephalic.  Eyes: Pupils are equal, round, and reactive to light.  Respiratory: Effort normal.  Musculoskeletal: Normal range of motion.  Neurological: She is alert and oriented to person, place, and time.  Skin: Skin is warm. She is not diaphoretic.  Psychiatric: Her behavior is normal.  Fetal tracing: Reactive tracing, + accels, no decels. No  contractions.   MAU Course  Procedures  none  MDM  Lr bolus X1 D5LR bolus X1 Pepcid and Zofran IV  Discussed patient with Dr. Gaetano Net.   Assessment and Plan   A:  1. Nausea and vomiting during pregnancy     P:  Discharge home in stable condition Rx: Zofran, Zantac  Return to MAU if symptoms worsen Small, frequent meals.  Follow up with OB.   Lezlie Lye, NP 01/03/2017 7:38 PM

## 2017-01-04 MED FILL — ONDANSETRON ODT 4 MG TABLET: 4 | 7 days supply | Qty: 20 | Fill #0

## 2017-01-04 MED FILL — raNITIdine HCL 150 MG TABS: 150 | 60 days supply | Qty: 60 | Fill #0

## 2017-01-24 DIAGNOSIS — Z23 Encounter for immunization: Secondary | ICD-10-CM | POA: Diagnosis not present

## 2017-01-24 DIAGNOSIS — Z3480 Encounter for supervision of other normal pregnancy, unspecified trimester: Secondary | ICD-10-CM | POA: Diagnosis not present

## 2017-01-24 DIAGNOSIS — Z3400 Encounter for supervision of normal first pregnancy, unspecified trimester: Secondary | ICD-10-CM | POA: Diagnosis not present

## 2017-02-01 MED FILL — DULoxetine HCL 60 MG CPEP: 60 | 30 days supply | Qty: 30 | Fill #3

## 2017-02-05 DIAGNOSIS — O99810 Abnormal glucose complicating pregnancy: Secondary | ICD-10-CM | POA: Diagnosis not present

## 2017-02-08 DIAGNOSIS — Z3400 Encounter for supervision of normal first pregnancy, unspecified trimester: Secondary | ICD-10-CM | POA: Diagnosis not present

## 2017-02-08 DIAGNOSIS — Z36 Encounter for antenatal screening for chromosomal anomalies: Secondary | ICD-10-CM | POA: Diagnosis not present

## 2017-02-19 ENCOUNTER — Encounter: Payer: Self-pay | Admitting: Skilled Nursing Facility1

## 2017-02-19 ENCOUNTER — Encounter: Payer: 59 | Attending: Obstetrics and Gynecology | Admitting: Skilled Nursing Facility1

## 2017-02-19 DIAGNOSIS — Z713 Dietary counseling and surveillance: Secondary | ICD-10-CM | POA: Insufficient documentation

## 2017-02-19 DIAGNOSIS — Z3A00 Weeks of gestation of pregnancy not specified: Secondary | ICD-10-CM | POA: Diagnosis not present

## 2017-02-19 DIAGNOSIS — O99810 Abnormal glucose complicating pregnancy: Secondary | ICD-10-CM | POA: Insufficient documentation

## 2017-02-19 NOTE — Progress Notes (Signed)
  Patient was seen on 02/19/17 for Gestational Diabetes self-management class at the [facility]. The following learning objectives were met by the patient during this course:   States the definition of Gestational Diabetes  States why dietary management is important in controlling blood glucose  Describes the effects each nutrient has on blood glucose levels  Demonstrates ability to create a balanced meal plan  Demonstrates carbohydrate counting   States when to check blood glucose levels involving a total of 4 separate occurences in a day  Demonstrates proper blood glucose monitoring techniques  States the effect of stress and exercise on blood glucose levels  States the importance of limiting caffeine and abstaining from alcohol and smoking  Demonstrates the knowledge the glucometer provided in class may not be covered by their insurance and to call their insurance provider immediately after class to know which glucometer their insurance provider does cover as well as calling their physician the next day for a prescription to the glucometer their insurance does cover (if the one provided is not) as well as the lancets and strips for that meter.  Blood glucose monitor given: Pt already had her own Blood glucose reading: 104 fasting  Patient instructed to monitor glucose levels: FBS: 60 - <90 1 hour: <140 2 hour: <120  *Patient received handouts:  Nutrition Diabetes and Pregnancy  Carbohydrate Counting List  Patient will be seen for follow-up as needed.

## 2017-03-02 DIAGNOSIS — Z3400 Encounter for supervision of normal first pregnancy, unspecified trimester: Secondary | ICD-10-CM | POA: Diagnosis not present

## 2017-03-02 DIAGNOSIS — O9989 Other specified diseases and conditions complicating pregnancy, childbirth and the puerperium: Secondary | ICD-10-CM | POA: Diagnosis not present

## 2017-03-02 DIAGNOSIS — Z3A32 32 weeks gestation of pregnancy: Secondary | ICD-10-CM | POA: Diagnosis not present

## 2017-03-02 MED FILL — HUMALOG 100 UNITS/ML KWIKPE: 100 | 30 days supply | Qty: 9 | Fill #0

## 2017-03-02 MED FILL — HUMULIN N 100 UNITS/ML KWIK: 100 | 30 days supply | Qty: 12 | Fill #0

## 2017-03-02 MED FILL — UNIFINE PENTIPS 31GX3/16: 31G X 5 MM | 30 days supply | Qty: 200 | Fill #0

## 2017-03-02 MED FILL — UNIFINE PENTIPS 31GX3/16": 31G X 5 MM | 30 days supply | Qty: 200 | Fill #0

## 2017-03-04 ENCOUNTER — Telehealth: Payer: 59 | Admitting: Family

## 2017-03-04 DIAGNOSIS — B9689 Other specified bacterial agents as the cause of diseases classified elsewhere: Secondary | ICD-10-CM

## 2017-03-04 DIAGNOSIS — J028 Acute pharyngitis due to other specified organisms: Secondary | ICD-10-CM

## 2017-03-04 MED ORDER — AZITHROMYCIN 250 MG PO TABS: ORAL_TABLET | ORAL | 0 refills | Status: DC

## 2017-03-04 NOTE — Progress Notes (Signed)
We are sorry that you are not feeling well.  Here is how we plan to help!  Based on what you have shared with me it looks like you have upper respiratory tract inflammation that has resulted in a significant cough.  Inflammation and infection in the upper respiratory tract is commonly called bronchitis and has four common causes:  Allergies, Viral Infections, Acid Reflux and Bacterial Infections.  Allergies, viruses and acid reflux are treated by controlling symptoms or eliminating the cause. An example might be a cough caused by taking certain blood pressure medications. You stop the cough by changing the medication. Another example might be a cough caused by acid reflux. Controlling the reflux helps control the cough.  Based on your presentation I believe you most likely have A cough due to bacteria.  When patients have a fever and a productive cough with a change in color or increased sputum production, we are concerned about bacterial bronchitis.  If left untreated it can progress to pneumonia.  If your symptoms do not improve with your treatment plan it is important that you contact your provider.   I have prescribed Azithromyin 250 mg: two tables now and then one tablet daily for 4 additonal days    In addition you may use Mucinex (Not the DM version). It is not safe in the first trimester, limited research in other trimesters, but generally considered safe in 3rd trimester. Z-pack (as above is definitely safe). I would recommend supportive care as below (fluids, rest) before considering the Mucinex (regular version) and only take it if supportive care does not work.    USE OF BRONCHODILATOR ("RESCUE") INHALERS: There is a risk from using your bronchodilator too frequently.  The risk is that over-reliance on a medication which only relaxes the muscles surrounding the breathing tubes can reduce the effectiveness of medications prescribed to reduce swelling and congestion of the tubes themselves.   Although you feel brief relief from the bronchodilator inhaler, your asthma may actually be worsening with the tubes becoming more swollen and filled with mucus.  This can delay other crucial treatments, such as oral steroid medications. If you need to use a bronchodilator inhaler daily, several times per day, you should discuss this with your provider.  There are probably better treatments that could be used to keep your asthma under control.     HOME CARE . Only take medications as instructed by your medical team. . Complete the entire course of an antibiotic. . Drink plenty of fluids and get plenty of rest. . Avoid close contacts especially the very young and the elderly . Cover your mouth if you cough or cough into your sleeve. . Always remember to wash your hands . A steam or ultrasonic humidifier can help congestion.   GET HELP RIGHT AWAY IF: . You develop worsening fever. . You become short of breath . You cough up blood. . Your symptoms persist after you have completed your treatment plan MAKE SURE YOU   Understand these instructions.  Will watch your condition.  Will get help right away if you are not doing well or get worse.  Your e-visit answers were reviewed by a board certified advanced clinical practitioner to complete your personal care plan.  Depending on the condition, your plan could have included both over the counter or prescription medications. If there is a problem please reply  once you have received a response from your provider. Your safety is important to [place].  If you have  drug allergies check your prescription carefully.    You can use MyChart to ask questions about today's visit, request a non-urgent call back, or ask for a work or school excuse for 24 hours related to this e-Visit. If it has been greater than 24 hours you will need to follow up with your provider, or enter a new e-Visit to address those concerns. You will get an e-mail in the next two days  asking about your experience.  I hope that your e-visit has been valuable and will speed your recovery. Thank you for using e-visits.

## 2017-03-05 DIAGNOSIS — Z3400 Encounter for supervision of normal first pregnancy, unspecified trimester: Secondary | ICD-10-CM | POA: Diagnosis not present

## 2017-03-05 DIAGNOSIS — O368330 Maternal care for abnormalities of the fetal heart rate or rhythm, third trimester, not applicable or unspecified: Secondary | ICD-10-CM | POA: Diagnosis not present

## 2017-03-05 DIAGNOSIS — Z3A32 32 weeks gestation of pregnancy: Secondary | ICD-10-CM | POA: Diagnosis not present

## 2017-03-08 DIAGNOSIS — O24419 Gestational diabetes mellitus in pregnancy, unspecified control: Secondary | ICD-10-CM | POA: Diagnosis not present

## 2017-03-08 DIAGNOSIS — Z3A33 33 weeks gestation of pregnancy: Secondary | ICD-10-CM | POA: Diagnosis not present

## 2017-03-08 DIAGNOSIS — Z3493 Encounter for supervision of normal pregnancy, unspecified, third trimester: Secondary | ICD-10-CM | POA: Diagnosis not present

## 2017-03-08 MED FILL — NIFEdipine 10 MG CAPS: 10 | 15 days supply | Qty: 60 | Fill #0

## 2017-03-12 DIAGNOSIS — Z3A33 33 weeks gestation of pregnancy: Secondary | ICD-10-CM | POA: Diagnosis not present

## 2017-03-12 DIAGNOSIS — Z3400 Encounter for supervision of normal first pregnancy, unspecified trimester: Secondary | ICD-10-CM | POA: Diagnosis not present

## 2017-03-12 DIAGNOSIS — O24419 Gestational diabetes mellitus in pregnancy, unspecified control: Secondary | ICD-10-CM | POA: Diagnosis not present

## 2017-03-14 MED FILL — DULoxetine HCL 60 MG CPEP: 60 | 30 days supply | Qty: 30 | Fill #4

## 2017-03-16 DIAGNOSIS — Z3400 Encounter for supervision of normal first pregnancy, unspecified trimester: Secondary | ICD-10-CM | POA: Diagnosis not present

## 2017-03-16 DIAGNOSIS — O9989 Other specified diseases and conditions complicating pregnancy, childbirth and the puerperium: Secondary | ICD-10-CM | POA: Diagnosis not present

## 2017-03-16 DIAGNOSIS — Z3A34 34 weeks gestation of pregnancy: Secondary | ICD-10-CM | POA: Diagnosis not present

## 2017-03-20 DIAGNOSIS — O24913 Unspecified diabetes mellitus in pregnancy, third trimester: Secondary | ICD-10-CM | POA: Diagnosis not present

## 2017-03-20 DIAGNOSIS — Z3400 Encounter for supervision of normal first pregnancy, unspecified trimester: Secondary | ICD-10-CM | POA: Diagnosis not present

## 2017-03-20 DIAGNOSIS — Z3A34 34 weeks gestation of pregnancy: Secondary | ICD-10-CM | POA: Diagnosis not present

## 2017-03-23 DIAGNOSIS — Z3400 Encounter for supervision of normal first pregnancy, unspecified trimester: Secondary | ICD-10-CM | POA: Diagnosis not present

## 2017-03-23 DIAGNOSIS — O24913 Unspecified diabetes mellitus in pregnancy, third trimester: Secondary | ICD-10-CM | POA: Diagnosis not present

## 2017-03-23 DIAGNOSIS — Z3480 Encounter for supervision of other normal pregnancy, unspecified trimester: Secondary | ICD-10-CM | POA: Diagnosis not present

## 2017-03-23 DIAGNOSIS — Z3A35 35 weeks gestation of pregnancy: Secondary | ICD-10-CM | POA: Diagnosis not present

## 2017-03-27 DIAGNOSIS — Z3400 Encounter for supervision of normal first pregnancy, unspecified trimester: Secondary | ICD-10-CM | POA: Diagnosis not present

## 2017-03-27 DIAGNOSIS — Z3A35 35 weeks gestation of pregnancy: Secondary | ICD-10-CM | POA: Diagnosis not present

## 2017-03-27 DIAGNOSIS — O9989 Other specified diseases and conditions complicating pregnancy, childbirth and the puerperium: Secondary | ICD-10-CM | POA: Diagnosis not present

## 2017-03-30 DIAGNOSIS — Z3400 Encounter for supervision of normal first pregnancy, unspecified trimester: Secondary | ICD-10-CM | POA: Diagnosis not present

## 2017-03-30 DIAGNOSIS — O9989 Other specified diseases and conditions complicating pregnancy, childbirth and the puerperium: Secondary | ICD-10-CM | POA: Diagnosis not present

## 2017-03-30 DIAGNOSIS — Z3A36 36 weeks gestation of pregnancy: Secondary | ICD-10-CM | POA: Diagnosis not present

## 2017-03-30 DIAGNOSIS — O2686 Pruritic urticarial papules and plaques of pregnancy (PUPPP): Secondary | ICD-10-CM | POA: Diagnosis not present

## 2017-04-03 DIAGNOSIS — Z3A36 36 weeks gestation of pregnancy: Secondary | ICD-10-CM | POA: Diagnosis not present

## 2017-04-03 DIAGNOSIS — O24913 Unspecified diabetes mellitus in pregnancy, third trimester: Secondary | ICD-10-CM | POA: Diagnosis not present

## 2017-04-03 DIAGNOSIS — Z3400 Encounter for supervision of normal first pregnancy, unspecified trimester: Secondary | ICD-10-CM | POA: Diagnosis not present

## 2017-04-04 ENCOUNTER — Encounter (HOSPITAL_COMMUNITY): Payer: Self-pay

## 2017-04-04 ENCOUNTER — Inpatient Hospital Stay (HOSPITAL_COMMUNITY): Payer: 59 | Admitting: Anesthesiology

## 2017-04-04 ENCOUNTER — Inpatient Hospital Stay (HOSPITAL_COMMUNITY)
Admission: AD | Admit: 2017-04-04 | Discharge: 2017-04-06 | DRG: 775 | Disposition: A | Discharge status: Home / Self Care | Payer: 59 | Source: Ambulatory Visit | Attending: Obstetrics & Gynecology | Admitting: Obstetrics & Gynecology

## 2017-04-04 DIAGNOSIS — Z3A37 37 weeks gestation of pregnancy: Secondary | ICD-10-CM | POA: Diagnosis not present

## 2017-04-04 DIAGNOSIS — Z3490 Encounter for supervision of normal pregnancy, unspecified, unspecified trimester: Secondary | ICD-10-CM

## 2017-04-04 DIAGNOSIS — O24424 Gestational diabetes mellitus in childbirth, insulin controlled: Principal | ICD-10-CM | POA: Diagnosis present

## 2017-04-04 LAB — TYPE AND SCREEN
ABO/RH(D): A POS
ANTIBODY SCREEN: NEGATIVE

## 2017-04-04 LAB — CBC
HEMATOCRIT: 31.9 % — AB (ref 36.0–46.0)
Hemoglobin: 10.3 g/dL — ABNORMAL LOW (ref 12.0–15.0)
MCH: 22.8 pg — ABNORMAL LOW (ref 26.0–34.0)
MCHC: 32.3 g/dL (ref 30.0–36.0)
MCV: 70.7 fL — ABNORMAL LOW (ref 78.0–100.0)
PLATELETS: 334 10*3/uL (ref 150–400)
RBC: 4.51 MIL/uL (ref 3.87–5.11)
RDW: 15.1 % (ref 11.5–15.5)
WBC: 11.2 10*3/uL — ABNORMAL HIGH (ref 4.0–10.5)

## 2017-04-04 LAB — POCT FERN TEST: POCT Fern Test: NEGATIVE

## 2017-04-04 LAB — ABO/RH: ABO/RH(D): A POS

## 2017-04-04 LAB — GLUCOSE, CAPILLARY
Glucose-Capillary: 109 mg/dL — ABNORMAL HIGH (ref 65–99)
Glucose-Capillary: 100 mg/dL — ABNORMAL HIGH (ref 65–99)
Glucose-Capillary: 85 mg/dL (ref 65–99)
Glucose-Capillary: 127 mg/dL — ABNORMAL HIGH (ref 65–99)

## 2017-04-04 MED ORDER — ONDANSETRON HCL 4 MG PO TABS: 4.0000 mg | ORAL_TABLET | ORAL | Status: DC | PRN

## 2017-04-04 MED ORDER — FENTANYL 2.5 MCG/ML BUPIVACAINE 1/10 % EPIDURAL INFUSION (WH - ANES)
14.0000 mL/h | INTRAMUSCULAR | Status: DC | PRN
  Administered 2017-04-04: 14 mL/h via EPIDURAL
  Filled 2017-04-04: qty 100

## 2017-04-04 MED ORDER — EPHEDRINE 5 MG/ML INJ
10.0000 mg | INTRAVENOUS | Status: DC | PRN
  Filled 2017-04-04: qty 2

## 2017-04-04 MED ORDER — ZOLPIDEM TARTRATE 5 MG PO TABS: 5.0000 mg | ORAL_TABLET | Freq: Every evening | ORAL | Status: DC | PRN

## 2017-04-04 MED ORDER — LACTATED RINGERS IV SOLN: INTRAVENOUS | Status: DC

## 2017-04-04 MED ORDER — SENNOSIDES-DOCUSATE SODIUM 8.6-50 MG PO TABS
2.0000 | ORAL_TABLET | ORAL | Status: DC
  Administered 2017-04-04 – 2017-04-06 (×2): 2 via ORAL
  Filled 2017-04-04 (×2): qty 2

## 2017-04-04 MED ORDER — ONDANSETRON HCL 4 MG/2ML IJ SOLN: 4.0000 mg | Freq: Four times a day (QID) | INTRAMUSCULAR | Status: DC | PRN

## 2017-04-04 MED ORDER — DIPHENHYDRAMINE HCL 25 MG PO CAPS: 25.0000 mg | ORAL_CAPSULE | Freq: Four times a day (QID) | ORAL | Status: DC | PRN

## 2017-04-04 MED ORDER — LACTATED RINGERS IV SOLN: 500.0000 mL | Freq: Once | INTRAVENOUS | Status: DC

## 2017-04-04 MED ORDER — OXYTOCIN BOLUS FROM INFUSION: 500.0000 mL | Freq: Once | INTRAVENOUS | Status: DC

## 2017-04-04 MED ORDER — COCONUT OIL OIL
1.0000 "application " | TOPICAL_OIL | Status: DC | PRN
  Administered 2017-04-04: 1 via TOPICAL
  Filled 2017-04-04: qty 120

## 2017-04-04 MED ORDER — ACETAMINOPHEN 325 MG PO TABS: 650.0000 mg | ORAL_TABLET | ORAL | Status: DC | PRN

## 2017-04-04 MED ORDER — OXYTOCIN 40 UNITS IN LACTATED RINGERS INFUSION - SIMPLE MED
2.5000 [IU]/h | INTRAVENOUS | Status: DC
  Filled 2017-04-04: qty 1000

## 2017-04-04 MED ORDER — PRENATAL MULTIVITAMIN CH
1.0000 | ORAL_TABLET | Freq: Every day | ORAL | Status: DC
  Administered 2017-04-04 – 2017-04-05 (×2): 1 via ORAL
  Filled 2017-04-04 (×2): qty 1

## 2017-04-04 MED ORDER — BUTORPHANOL TARTRATE 1 MG/ML IJ SOLN
INTRAMUSCULAR | Status: AC
  Administered 2017-04-04: 2 mg via INTRAVENOUS
  Filled 2017-04-04: qty 2

## 2017-04-04 MED ORDER — LACTATED RINGERS IV SOLN: 500.0000 mL | INTRAVENOUS | Status: DC | PRN

## 2017-04-04 MED ORDER — BUTORPHANOL TARTRATE 1 MG/ML IJ SOLN
2.0000 mg | Freq: Once | INTRAMUSCULAR | Status: AC
  Administered 2017-04-04: 2 mg via INTRAVENOUS

## 2017-04-04 MED ORDER — SOD CITRATE-CITRIC ACID 500-334 MG/5ML PO SOLN: 30.0000 mL | ORAL | Status: DC | PRN

## 2017-04-04 MED ORDER — OXYCODONE-ACETAMINOPHEN 5-325 MG PO TABS: 2.0000 | ORAL_TABLET | ORAL | Status: DC | PRN

## 2017-04-04 MED ORDER — LIDOCAINE HCL (PF) 1 % IJ SOLN
30.0000 mL | INTRAMUSCULAR | Status: DC | PRN
  Filled 2017-04-04: qty 30

## 2017-04-04 MED ORDER — DIBUCAINE 1 % RE OINT: 1.0000 "application " | TOPICAL_OINTMENT | RECTAL | Status: DC | PRN

## 2017-04-04 MED ORDER — OXYCODONE-ACETAMINOPHEN 5-325 MG PO TABS: 1.0000 | ORAL_TABLET | ORAL | Status: DC | PRN

## 2017-04-04 MED ORDER — PHENYLEPHRINE 40 MCG/ML (10ML) SYRINGE FOR IV PUSH (FOR BLOOD PRESSURE SUPPORT)
80.0000 ug | PREFILLED_SYRINGE | INTRAVENOUS | Status: DC | PRN
  Filled 2017-04-04: qty 5

## 2017-04-04 MED ORDER — TETANUS-DIPHTH-ACELL PERTUSSIS 5-2.5-18.5 LF-MCG/0.5 IM SUSP: 0.5000 mL | Freq: Once | INTRAMUSCULAR | Status: DC

## 2017-04-04 MED ORDER — ACETAMINOPHEN 325 MG PO TABS
650.0000 mg | ORAL_TABLET | ORAL | Status: DC | PRN
  Administered 2017-04-04: 650 mg via ORAL
  Filled 2017-04-04: qty 2

## 2017-04-04 MED ORDER — WITCH HAZEL-GLYCERIN EX PADS: 1.0000 "application " | MEDICATED_PAD | CUTANEOUS | Status: DC | PRN

## 2017-04-04 MED ORDER — ONDANSETRON HCL 4 MG/2ML IJ SOLN: 4.0000 mg | INTRAMUSCULAR | Status: DC | PRN

## 2017-04-04 MED ORDER — DIPHENHYDRAMINE HCL 50 MG/ML IJ SOLN: 12.5000 mg | INTRAMUSCULAR | Status: DC | PRN

## 2017-04-04 MED ORDER — DULOXETINE HCL 60 MG PO CPEP
60.0000 mg | ORAL_CAPSULE | ORAL | Status: DC
  Administered 2017-04-05 – 2017-04-06 (×2): 60 mg via ORAL
  Filled 2017-04-04 (×2): qty 1

## 2017-04-04 MED ORDER — LIDOCAINE HCL (PF) 1 % IJ SOLN
INTRAMUSCULAR | Status: DC | PRN
  Administered 2017-04-04: 5 mL via EPIDURAL
  Administered 2017-04-04: 2 mL via EPIDURAL
  Administered 2017-04-04: 3 mL via EPIDURAL

## 2017-04-04 MED ORDER — BENZOCAINE-MENTHOL 20-0.5 % EX AERO
1.0000 "application " | INHALATION_SPRAY | CUTANEOUS | Status: DC | PRN
  Administered 2017-04-04: 1 via TOPICAL
  Filled 2017-04-04: qty 56

## 2017-04-04 MED ORDER — SIMETHICONE 80 MG PO CHEW: 80.0000 mg | CHEWABLE_TABLET | ORAL | Status: DC | PRN

## 2017-04-04 MED ORDER — PHENYLEPHRINE 40 MCG/ML (10ML) SYRINGE FOR IV PUSH (FOR BLOOD PRESSURE SUPPORT)
80.0000 ug | PREFILLED_SYRINGE | INTRAVENOUS | Status: DC | PRN
  Filled 2017-04-04: qty 5
  Filled 2017-04-04: qty 10

## 2017-04-04 MED ORDER — IBUPROFEN 600 MG PO TABS
600.0000 mg | ORAL_TABLET | Freq: Four times a day (QID) | ORAL | Status: DC
  Administered 2017-04-04 – 2017-04-06 (×7): 600 mg via ORAL
  Filled 2017-04-04 (×8): qty 1

## 2017-04-04 MED ORDER — FLEET ENEMA 7-19 GM/118ML RE ENEM: 1.0000 | ENEMA | RECTAL | Status: DC | PRN

## 2017-04-04 NOTE — Progress Notes (Signed)
Nurse called to pt's bedside.  Mom in side lying position with infant latched to right breast. Infant taking in all of nipple.  Mom states "this position works good for me"  Pt instructed to massage infant's jaws after feeding due to size and to decrease discomfort in infant's jaws.  Pt v/u.  Sig other at bedside.

## 2017-04-04 NOTE — MAU Note (Addendum)
Pt. Here with c/o rupture of membrane.  SROM at 0400 this morning, contractions getting stronger. Positive for fetal movement, denies vaginal bleeding. EFM applied - FHR 150s, Toco applied. CBG 109 mg/dl (GDM on insulin).

## 2017-04-04 NOTE — Progress Notes (Signed)
Nurse called to pt's room to assist w/latch.  Nurse noted pt to have larger button size nipples.  Infant attempted to latch and was unsuccessful.  Infant sleepy and reluctant to latch.  Nurse instructed pt on how to manually express breast milk and how to  use of breast pump to assist w/ mild stimulation.  Nurse obtained size 34mm and 41mm flanges for pt to use with breast pump.  Pt pumped for 20 minutes and did not see any colostrum.  LC to follow up with mom.

## 2017-04-04 NOTE — Anesthesia Procedure Notes (Signed)
Epidural Patient location during procedure: OB Start time: 04/04/2017 6:30 AM End time: 04/04/2017 6:35 AM  Staffing Anesthesiologist: Catalina Gravel Performed: anesthesiologist   Preanesthetic Checklist Completed: patient identified, pre-op evaluation, timeout performed, IV checked, risks and benefits discussed and monitors and equipment checked  Epidural Patient position: sitting Prep: DuraPrep Patient monitoring: blood pressure and continuous pulse ox Approach: midline Location: L3-L4 Injection technique: LOR air  Needle:  Needle type: Tuohy  Needle gauge: 17 G Needle length: 9 cm Needle insertion depth: 4 cm Catheter size: 19 Gauge Catheter at skin depth: 9 cm Test dose: negative and Other (1% Lidocaine)  Additional Notes Patient identified.  Risk benefits discussed including failed block, incomplete pain control, headache, nerve damage, paralysis, blood pressure changes, nausea, vomiting, reactions to medication both toxic or allergic, and postpartum back pain.  Patient expressed understanding and wished to proceed.  All questions were answered.  Sterile technique used throughout procedure and epidural site dressed with sterile barrier dressing. No paresthesia or other complications noted. The patient did not experience any signs of intravascular injection such as tinnitus or metallic taste in mouth nor signs of intrathecal spread such as rapid motor block. Please see nursing notes for vital signs. Reason for block:procedure for pain

## 2017-04-04 NOTE — Anesthesia Pain Management Evaluation Note (Signed)
  CRNA Pain Management Visit Note  Patient: Olivia Pruitt, 36 y.o., female  "Hello I am a member of the anesthesia team at [facility]. We have an anesthesia team available at all times to provide care throughout the hospital, including epidural management and anesthesia for C-section. I don't know your plan for the delivery whether it a natural birth, water birth, IV sedation, nitrous supplementation, doula or epidural, but we want to meet your pain goals."   1.Was your pain managed to your expectations on prior hospitalizations?   Yes   2.What is your expectation for pain management during this hospitalization?     Epidural  3.How can we help you reach that goal?   Record the patient's initial score and the patient's pain goal.   Pain: 0  Pain Goal: 5 [facility] wants you to be able to say your pain was always managed very well.  Jabier Mutton 04/04/2017

## 2017-04-04 NOTE — Progress Notes (Signed)
MOB was referred for history of depression/anxiety. * Referral screened out by Clinical Social Worker because none of the following criteria appear to apply: ~ History of anxiety/depression during this pregnancy, or of post-partum depression. ~ Diagnosis of anxiety and/or depression within last 3 years OR * MOB's symptoms currently being treated with medication and/or therapy. Please contact the Clinical Social Worker if needs arise, or if MOB requests.  MOB has Rx for Cymbalta.

## 2017-04-04 NOTE — Progress Notes (Signed)
Olivia Pruitt is a 36 y.o. G3P2002 at [redacted]w[redacted]d by ultrasound admitted for active labor  Subjective: Comfortable with epidural  Objective: BP 119/73   Pulse 79   Temp 97.6 F (36.4 C) (Oral)   Resp 15   Ht 5\' 2"  (1.575 m)   Wt 179 lb (81.2 kg)   SpO2 94%   BMI 32.74 kg/m  No intake/output data recorded. No intake/output data recorded.  FHT:  FHR: 125 bpm, variability: moderate,  accelerations:  Present,  decelerations:  Absent UC:   regular, every 2-3 minutes SVE:   Dilation: 7 Effacement (%): 90 Station: -2 Exam by:: Dr. Lynnette Caffey  Labs: Lab Results  Component Value Date   WBC 11.2 (H) 04/04/2017   HGB 10.3 (L) 04/04/2017   HCT 31.9 (L) 04/04/2017   MCV 70.7 (L) 04/04/2017   PLT 334 04/04/2017    Assessment / Plan: Spontaneous labor, progressing normally  Labor: Progressing normally Preeclampsia:  n/a Fetal Wellbeing:  Category I Pain Control:  Epidural I/D:  n/a Anticipated MOD:  NSVD  IDGDM-no meds currently.  Monitor q 2h.  [name], [place] 04/04/2017, 8:10 AM

## 2017-04-04 NOTE — Anesthesia Postprocedure Evaluation (Signed)
Anesthesia Post Note  Patient: Olivia Pruitt  Procedure(s) Performed: * No procedures listed *  Patient location during evaluation: Mother Baby Anesthesia Type: Epidural Level of consciousness: awake and alert and oriented Pain management: pain level controlled Vital Signs Assessment: post-procedure vital signs reviewed and stable Respiratory status: spontaneous breathing and nonlabored ventilation Cardiovascular status: stable Postop Assessment: no headache, epidural receding, patient able to bend at knees, adequate PO intake, no backache and no signs of nausea or vomiting Anesthetic complications: no        Last Vitals:  Vitals:   04/04/17 1150 04/04/17 1244  BP: 124/81 113/70  Pulse: 79 75  Resp: 16 16  Temp: 36.9 C 36.7 C    Last Pain:  Vitals:   04/04/17 1244  TempSrc: Oral  PainSc: 0-No pain   Pain Goal:                 [employer]

## 2017-04-04 NOTE — H&P (Signed)
Olivia Pruitt is a 36 y.o. female G3P2 @ 37 +4 wks presenting for SOL/ROM .  Pregnancy complicated by A2 GDM - moderately controlled with insulin.  BG 100 @ 7am  OB History    Gravida Para Term Preterm AB Living   3 2 2     2    SAB TAB Ectopic Multiple Live Births           2     Past Medical History:  Diagnosis Date  . Adult ADHD   . Depression   . Diabetes mellitus without complication ([place])   . Headache    Past Surgical History:  Procedure Laterality Date  . NO PAST SURGERIES     Family History: family history includes Hyperlipidemia in her father and mother. Social History:  reports that she has never smoked. She has never used smokeless tobacco. She reports that she does not drink alcohol or use drugs.     Maternal Diabetes: Yes:  Diabetes Type:  Insulin/Medication controlled Genetic Screening: Declined Maternal Ultrasounds/Referrals: Normal Fetal Ultrasounds or other Referrals:  None Maternal Substance Abuse:  No Significant Maternal Medications:  None Significant Maternal Lab Results:  None Other Comments:  None  ROS History Dilation: 5 Effacement (%): 80 Station: 0 Exam by:: Millner, RN  Blood pressure 123/73, pulse 67, temperature 97.7 F (36.5 C), temperature source Axillary, resp. rate 18, height 5\' 2"  (1.575 m), weight 179 lb (81.2 kg), SpO2 95 %. Exam Physical Exam  Gen - uncomfortable w/ epidural Abd - gravid, NT Ext - NT Cvx 5cm Prenatal labs: ABO, Rh: --/--/A POS (05/23 0550) Antibody: NEG (05/23 0550) Rubella:   RPR:    HBsAg:    HIV:    GBS:   neg  Assessment/Plan: Admit Epidural prn No insulin given this am - BG 100   Kieley Akter 04/04/2017, 7:07 AM

## 2017-04-04 NOTE — Lactation Note (Signed)
This note was copied from a baby's chart. Lactation Consultation Note  Patient Name: Olivia Pruitt Date: 04/04/2017 Reason for consult: Initial assessment;Other (Comment) (baby is 37 weeks . 1st [place] visit in L/D , ) Room  173 , mom awake ( expressed she was feeling very tired but desired to try latching, dad at bedside.  Baby is 1 hours old, STS when [place] walked in the room.  [place] 1st reviewed hand expressing - no EBM yield.  Baby awake for short interval, rooting , mouthing nipple, and released. Baby did open wide, and then acted like he wasn't interested. LC assisted mom to reposition baby on her chest , baby and mom comfortable. While assisting with latching baby spit small amount of clear frothy to mucous , LC used bulb syringe to mouth and nose.baby clear.  LC tried to hand express after attempt to latch both breast without yield. Reassured mom it takes times and left colostrum collectors with her.  Mother informed of post-discharge support and given phone number to the lactation department, including services for phone call assistance; out-patient appointments; and breastfeeding support group. List of other breastfeeding resources in the community given in the handout. Encouraged mother to call for problems or concerns related to breastfeeding.  Mom aware to call with feeding cues, and aware nurses and LC's can assist to latch.      Maternal Data Has patient been taught Hand Expression?: Yes ([place] reviewed w/ mom. mom to tired to return demo. no EBM yield either breast ) Does the patient have breastfeeding experience prior to this delivery?: Yes  Feeding Feeding Type: Breast Fed Length of feed:  (baby mouthing nipple , no latch )  LATCH Score/Interventions Latch: Repeated attempts needed to sustain latch, nipple held in mouth throughout feeding, stimulation needed to elicit sucking reflex. Intervention(s): Adjust position;Assist with latch;Breast massage;Breast  compression  Audible Swallowing: None Intervention(s): Hand expression;Skin to skin  Type of Nipple: Everted at rest and after stimulation (compressible areolas , large nipples )  Comfort (Breast/Nipple): Soft / non-tender     Hold (Positioning): Full assist, staff holds infant at breast Intervention(s): Breastfeeding basics reviewed;Support Pillows;Position options;Skin to skin  LATCH Score: 5  Lactation Tools Discussed/Used WIC Program: No   Consult Status Consult Status: Follow-up Date: 04/04/17 Follow-up type: In-patient    [facility] 04/04/2017, 10:41 AM

## 2017-04-04 NOTE — Anesthesia Preprocedure Evaluation (Signed)
Anesthesia Evaluation  Patient identified by MRN, date of birth, ID band Patient awake    Reviewed: Allergy & Precautions, NPO status , Patient's Chart, lab work & pertinent test results  Airway Mallampati: II  TM Distance: >3 FB Neck ROM: Full    Dental  (+) Teeth Intact, Dental Advisory Given   Pulmonary neg pulmonary ROS,    Pulmonary exam normal breath sounds clear to auscultation       Cardiovascular negative cardio ROS Normal cardiovascular exam Rhythm:Regular Rate:Normal     Neuro/Psych  Headaches, PSYCHIATRIC DISORDERS Depression    GI/Hepatic negative GI ROS, Neg liver ROS,   Endo/Other  diabetes, Gestational, Insulin DependentObesity   Renal/GU negative Renal ROS     Musculoskeletal negative musculoskeletal ROS (+)   Abdominal   Peds  Hematology  (+) Blood dyscrasia, anemia , Plt 334k   Anesthesia Other Findings Day of surgery medications reviewed with the patient.  Reproductive/Obstetrics (+) Pregnancy                             Anesthesia Physical Anesthesia Plan  ASA: II  Anesthesia Plan: Epidural   Post-op Pain Management:    Induction:   Airway Management Planned:   Additional Equipment:   Intra-op Plan:   Post-operative Plan:   Informed Consent: I have reviewed the patients History and Physical, chart, labs and discussed the procedure including the risks, benefits and alternatives for the proposed anesthesia with the patient or authorized representative who has indicated his/her understanding and acceptance.   Dental advisory given  Plan Discussed with:   Anesthesia Plan Comments: (Patient identified. Risks/Benefits/Options discussed with patient including but not limited to bleeding, infection, nerve damage, paralysis, failed block, incomplete pain control, headache, blood pressure changes, nausea, vomiting, reactions to medication both or allergic,  itching and postpartum back pain. Confirmed with bedside nurse the patient's most recent platelet count. Confirmed with patient that they are not currently taking any anticoagulation, have any bleeding history or any family history of bleeding disorders. Patient expressed understanding and wished to proceed. All questions were answered. )        Anesthesia Quick Evaluation

## 2017-04-05 LAB — CBC
HEMATOCRIT: 27.9 % — AB (ref 36.0–46.0)
HEMOGLOBIN: 8.9 g/dL — AB (ref 12.0–15.0)
MCH: 23 pg — AB (ref 26.0–34.0)
MCHC: 31.9 g/dL (ref 30.0–36.0)
MCV: 72.1 fL — ABNORMAL LOW (ref 78.0–100.0)
Platelets: 307 10*3/uL (ref 150–400)
RBC: 3.87 MIL/uL (ref 3.87–5.11)
RDW: 15.2 % (ref 11.5–15.5)
WBC: 15.9 10*3/uL — ABNORMAL HIGH (ref 4.0–10.5)

## 2017-04-05 LAB — GLUCOSE, CAPILLARY
GLUCOSE-CAPILLARY: 115 mg/dL — AB (ref 65–99)
GLUCOSE-CAPILLARY: 96 mg/dL (ref 65–99)
Glucose-Capillary: 139 mg/dL — ABNORMAL HIGH (ref 65–99)

## 2017-04-05 LAB — RPR: RPR Ser Ql: NONREACTIVE

## 2017-04-05 MED ORDER — TRAMADOL HCL 50 MG PO TABS
50.0000 mg | ORAL_TABLET | Freq: Four times a day (QID) | ORAL | Status: DC
  Administered 2017-04-05 – 2017-04-06 (×3): 50 mg via ORAL
  Filled 2017-04-05 (×3): qty 1

## 2017-04-05 NOTE — Lactation Note (Signed)
This note was copied from a baby's chart. Lactation Consultation Note  Patient Name: Olivia Pruitt NIOEV'O Date: 04/05/2017  Mom states baby is still sleepy at breast.  She also states that he is not obtaining deep latch.  Mom is currently pumping with DEBP.  Instructed to call [place] for latch assist when baby starts to cue.   Maternal Data    Feeding Feeding Type: Breast Fed Length of feed: 0 min  LATCH Score/Interventions                      Lactation Tools Discussed/Used     Consult Status      Ave Filter 04/05/2017, 2:01 PM

## 2017-04-05 NOTE — Progress Notes (Signed)
Post Partum Day 1 Subjective: up ad lib, voiding, tolerating PO and + flatus Does not desire circ Objective: Blood pressure (!) 119/57, pulse 77, temperature 97.7 F (36.5 C), temperature source Oral, resp. rate 17, height 5\' 2"  (1.575 m), weight 179 lb (81.2 kg), SpO2 99 %.  Physical Exam:  General: alert and cooperative Lochia: appropriate Uterine Fundus: firm Incision: healing well DVT Evaluation: No evidence of DVT seen on physical exam. Negative Homan's sign. No cords or calf tenderness. No significant calf/ankle edema. Sugars 109- 115 mg /dl  Recent Labs  04/04/17 0530 04/05/17 0553  HGB 10.3* 8.9*  HCT 31.9* 27.9*    Assessment/Plan: Plan for discharge tomorrow Plan 3 months  pp Hgb A1c   LOS: 1 day   Olivia Pruitt,Olivia Pruitt 04/05/2017, 8:23 AM

## 2017-04-06 LAB — GLUCOSE, CAPILLARY: GLUCOSE-CAPILLARY: 83 mg/dL (ref 65–99)

## 2017-04-06 MED ORDER — TRAMADOL HCL 50 MG PO TABS: 50.0000 mg | ORAL_TABLET | Freq: Four times a day (QID) | ORAL | 0 refills | Status: DC

## 2017-04-06 MED ORDER — IBUPROFEN 600 MG PO TABS: 600.0000 mg | ORAL_TABLET | Freq: Four times a day (QID) | ORAL | 1 refills | Status: DC

## 2017-04-06 MED FILL — IBUPROFEN 600 MG TABLET: 600 | 8 days supply | Qty: 30 | Fill #0

## 2017-04-06 MED FILL — traMADol HCL 50 MG TABS: 50 | 8 days supply | Qty: 30 | Fill #0

## 2017-04-06 NOTE — Discharge Summary (Signed)
Obstetric Discharge Summary Reason for Admission: rupture of membranes Prenatal Procedures: ultrasound Intrapartum Procedures: vacuum Postpartum Procedures: none Complications-Operative and Postpartum: vaginal laceration Hemoglobin  Date Value Ref Range Status  04/05/2017 8.9 (L) 12.0 - 15.0 g/dL Final   HCT  Date Value Ref Range Status  04/05/2017 27.9 (L) 36.0 - 46.0 % Final    Physical Exam:  General: alert and cooperative Lochia: appropriate Uterine Fundus: firm Incision: healing well DVT Evaluation: No evidence of DVT seen on physical exam. Negative Homan's sign. No cords or calf tenderness. No significant calf/ankle edema.  Discharge Diagnoses: Term Pregnancy-delivered  Discharge Information: Date: 04/06/2017 Activity: pelvic rest Diet: routine Medications: PNV, Ibuprofen and ultram Condition: stable Instructions: refer to practice specific booklet Discharge to: home plan check Hgb a1c in 3 months  Newborn Data: Live born female  Birth Weight: 8 lb 5 oz (3771 g) APGAR: 9, 9  Home with mother.  Olivia Pruitt G 04/06/2017, 8:32 AM

## 2017-04-18 ENCOUNTER — Inpatient Hospital Stay (HOSPITAL_COMMUNITY): Payer: 59

## 2017-04-30 MED FILL — DULoxetine HCL 60 MG CPEP: 60 | 30 days supply | Qty: 30 | Fill #5

## 2017-05-08 DIAGNOSIS — Z1389 Encounter for screening for other disorder: Secondary | ICD-10-CM | POA: Diagnosis not present

## 2017-05-25 DIAGNOSIS — Z30430 Encounter for insertion of intrauterine contraceptive device: Secondary | ICD-10-CM | POA: Diagnosis not present

## 2017-05-29 DIAGNOSIS — F338 Other recurrent depressive disorders: Secondary | ICD-10-CM | POA: Diagnosis not present

## 2017-05-29 DIAGNOSIS — F902 Attention-deficit hyperactivity disorder, combined type: Secondary | ICD-10-CM | POA: Diagnosis not present

## 2017-05-29 DIAGNOSIS — F411 Generalized anxiety disorder: Secondary | ICD-10-CM | POA: Diagnosis not present

## 2017-05-29 DIAGNOSIS — F1920 Other psychoactive substance dependence, uncomplicated: Secondary | ICD-10-CM | POA: Diagnosis not present

## 2017-05-29 DIAGNOSIS — Z79899 Other long term (current) drug therapy: Secondary | ICD-10-CM | POA: Diagnosis not present

## 2017-06-14 MED FILL — DULoxetine HCL 60 MG CPEP: 60 | 30 days supply | Qty: 30 | Fill #6

## 2017-07-06 DIAGNOSIS — N939 Abnormal uterine and vaginal bleeding, unspecified: Secondary | ICD-10-CM | POA: Diagnosis not present

## 2017-07-27 MED FILL — DULoxetine HCL 60 MG CPEP: 60 | 30 days supply | Qty: 30 | Fill #0

## 2017-09-04 MED FILL — DULoxetine HCL 60 MG CPEP: 60 | 30 days supply | Qty: 30 | Fill #1

## 2017-09-17 ENCOUNTER — Encounter (HOSPITAL_COMMUNITY): Payer: Self-pay

## 2017-10-09 MED FILL — MONTELUKAST SOD 10 MG TAB: 10 | 30 days supply | Qty: 30 | Fill #0

## 2017-10-09 MED FILL — DULoxetine HCL 60 MG CPEP: 60 | 30 days supply | Qty: 30 | Fill #2

## 2017-11-21 MED FILL — DULoxetine HCL 60 MG CPEP: 60 | 30 days supply | Qty: 30 | Fill #3

## 2017-12-30 DIAGNOSIS — Z20818 Contact with and (suspected) exposure to other bacterial communicable diseases: Secondary | ICD-10-CM | POA: Diagnosis not present

## 2017-12-30 DIAGNOSIS — J09X2 Influenza due to identified novel influenza A virus with other respiratory manifestations: Secondary | ICD-10-CM | POA: Diagnosis not present

## 2017-12-31 MED FILL — DULoxetine HCL 60 MG CPEP: 60 | 30 days supply | Qty: 30 | Fill #4

## 2018-01-08 DIAGNOSIS — Z0000 Encounter for general adult medical examination without abnormal findings: Secondary | ICD-10-CM | POA: Diagnosis not present

## 2018-01-08 DIAGNOSIS — N8110 Cystocele, unspecified: Secondary | ICD-10-CM | POA: Diagnosis not present

## 2018-01-08 DIAGNOSIS — F419 Anxiety disorder, unspecified: Secondary | ICD-10-CM | POA: Diagnosis not present

## 2018-01-08 DIAGNOSIS — O24419 Gestational diabetes mellitus in pregnancy, unspecified control: Secondary | ICD-10-CM | POA: Diagnosis not present

## 2018-02-04 MED FILL — DULoxetine HCL 60 MG CPEP: 60 | 30 days supply | Qty: 30 | Fill #5

## 2018-02-16 ENCOUNTER — Telehealth: Payer: 59 | Admitting: Nurse Practitioner

## 2018-02-16 DIAGNOSIS — R059 Cough, unspecified: Secondary | ICD-10-CM

## 2018-02-16 DIAGNOSIS — R05 Cough: Secondary | ICD-10-CM

## 2018-02-16 MED ORDER — PREDNISONE 10 MG (21) PO TBPK: ORAL_TABLET | ORAL | 0 refills | Status: DC

## 2018-02-16 MED ORDER — AZITHROMYCIN 250 MG PO TABS: ORAL_TABLET | ORAL | 0 refills | Status: DC

## 2018-02-16 MED ORDER — BENZONATATE 100 MG PO CAPS: 100.0000 mg | ORAL_CAPSULE | Freq: Three times a day (TID) | ORAL | 0 refills | Status: DC | PRN

## 2018-02-16 NOTE — Progress Notes (Signed)
We are sorry that you are not feeling well.  Here is how we plan to help!  Based on your presentation I believe you most likely have A cough due to bacteria.  When patients have a fever and a productive cough with a change in color or increased sputum production, we are concerned about bacterial bronchitis.  If left untreated it can progress to pneumonia.  If your symptoms do not improve with your treatment plan it is important that you contact your provider.   I have prescribed Azithromyin 250 mg: two tablets now and then one tablet daily for 4 additonal days    In addition you may use A prescription cough medication called Tessalon Perles 100mg . You may take 1-2 capsules every 8 hours as needed for your cough.  Sterapred 10 mg dosepak  From your responses in the eVisit questionnaire you describe inflammation in the upper respiratory tract which is causing a significant cough.  This is commonly called Bronchitis and has four common causes:    Allergies  Viral Infections  Acid Reflux  Bacterial Infection Allergies, viruses and acid reflux are treated by controlling symptoms or eliminating the cause. An example might be a cough caused by taking certain blood pressure medications. You stop the cough by changing the medication. Another example might be a cough caused by acid reflux. Controlling the reflux helps control the cough.  USE OF BRONCHODILATOR ("RESCUE") INHALERS: There is a risk from using your bronchodilator too frequently.  The risk is that over-reliance on a medication which only relaxes the muscles surrounding the breathing tubes can reduce the effectiveness of medications prescribed to reduce swelling and congestion of the tubes themselves.  Although you feel brief relief from the bronchodilator inhaler, your asthma may actually be worsening with the tubes becoming more swollen and filled with mucus.  This can delay other crucial treatments, such as oral steroid medications. If you  need to use a bronchodilator inhaler daily, several times per day, you should discuss this with your provider.  There are probably better treatments that could be used to keep your asthma under control.     HOME CARE . Only take medications as instructed by your medical team. . Complete the entire course of an antibiotic. . Drink plenty of fluids and get plenty of rest. . Avoid close contacts especially the very young and the elderly . Cover your mouth if you cough or cough into your sleeve. . Always remember to wash your hands . A steam or ultrasonic humidifier can help congestion.   GET HELP RIGHT AWAY IF: . You develop worsening fever. . You become short of breath . You cough up blood. . Your symptoms persist after you have completed your treatment plan MAKE SURE YOU   Understand these instructions.  Will watch your condition.  Will get help right away if you are not doing well or get worse.  Your e-visit answers were reviewed by a board certified advanced clinical practitioner to complete your personal care plan.  Depending on the condition, your plan could have included both over the counter or prescription medications. If there is a problem please reply  once you have received a response from your provider. Your safety is important to [place].  If you have drug allergies check your prescription carefully.    You can use MyChart to ask questions about today's visit, request a non-urgent call back, or ask for a work or school excuse for 24 hours related to this e-Visit.  If it has been greater than 24 hours you will need to follow up with your provider, or enter a new e-Visit to address those concerns. You will get an e-mail in the next two days asking about your experience.  I hope that your e-visit has been valuable and will speed your recovery. Thank you for using e-visits.   

## 2018-03-05 DIAGNOSIS — N3946 Mixed incontinence: Secondary | ICD-10-CM | POA: Diagnosis not present

## 2018-03-05 DIAGNOSIS — R350 Frequency of micturition: Secondary | ICD-10-CM | POA: Diagnosis not present

## 2018-03-05 DIAGNOSIS — R351 Nocturia: Secondary | ICD-10-CM | POA: Diagnosis not present

## 2018-03-18 DIAGNOSIS — R351 Nocturia: Secondary | ICD-10-CM | POA: Diagnosis not present

## 2018-03-18 DIAGNOSIS — M62838 Other muscle spasm: Secondary | ICD-10-CM | POA: Diagnosis not present

## 2018-03-18 DIAGNOSIS — R350 Frequency of micturition: Secondary | ICD-10-CM | POA: Diagnosis not present

## 2018-03-18 DIAGNOSIS — N3946 Mixed incontinence: Secondary | ICD-10-CM | POA: Diagnosis not present

## 2018-03-18 DIAGNOSIS — M6281 Muscle weakness (generalized): Secondary | ICD-10-CM | POA: Diagnosis not present

## 2018-03-18 MED FILL — DULoxetine HCL 60 MG CPEP: 60 | 30 days supply | Qty: 30 | Fill #6

## 2018-04-10 DIAGNOSIS — R41840 Attention and concentration deficit: Secondary | ICD-10-CM | POA: Diagnosis not present

## 2018-04-10 DIAGNOSIS — F411 Generalized anxiety disorder: Secondary | ICD-10-CM | POA: Diagnosis not present

## 2018-04-10 DIAGNOSIS — F338 Other recurrent depressive disorders: Secondary | ICD-10-CM | POA: Diagnosis not present

## 2018-04-22 MED FILL — DULoxetine HCL 60 MG CPEP: 60 | 30 days supply | Qty: 30 | Fill #7

## 2018-05-29 MED FILL — DULoxetine HCL 60 MG CPEP: 60 | 30 days supply | Qty: 30 | Fill #8

## 2018-07-10 MED FILL — DULoxetine HCL 60 MG CPEP: 60 | 30 days supply | Qty: 30 | Fill #9

## 2018-07-18 DIAGNOSIS — M9905 Segmental and somatic dysfunction of pelvic region: Secondary | ICD-10-CM | POA: Diagnosis not present

## 2018-07-18 DIAGNOSIS — M9903 Segmental and somatic dysfunction of lumbar region: Secondary | ICD-10-CM | POA: Diagnosis not present

## 2018-07-18 DIAGNOSIS — M5386 Other specified dorsopathies, lumbar region: Secondary | ICD-10-CM | POA: Diagnosis not present

## 2018-07-18 DIAGNOSIS — M9902 Segmental and somatic dysfunction of thoracic region: Secondary | ICD-10-CM | POA: Diagnosis not present

## 2018-07-23 DIAGNOSIS — M9905 Segmental and somatic dysfunction of pelvic region: Secondary | ICD-10-CM | POA: Diagnosis not present

## 2018-07-23 DIAGNOSIS — M9903 Segmental and somatic dysfunction of lumbar region: Secondary | ICD-10-CM | POA: Diagnosis not present

## 2018-07-23 DIAGNOSIS — M9902 Segmental and somatic dysfunction of thoracic region: Secondary | ICD-10-CM | POA: Diagnosis not present

## 2018-07-23 DIAGNOSIS — M5386 Other specified dorsopathies, lumbar region: Secondary | ICD-10-CM | POA: Diagnosis not present

## 2018-07-24 DIAGNOSIS — M9905 Segmental and somatic dysfunction of pelvic region: Secondary | ICD-10-CM | POA: Diagnosis not present

## 2018-07-24 DIAGNOSIS — M9902 Segmental and somatic dysfunction of thoracic region: Secondary | ICD-10-CM | POA: Diagnosis not present

## 2018-07-24 DIAGNOSIS — M5386 Other specified dorsopathies, lumbar region: Secondary | ICD-10-CM | POA: Diagnosis not present

## 2018-07-24 DIAGNOSIS — M9903 Segmental and somatic dysfunction of lumbar region: Secondary | ICD-10-CM | POA: Diagnosis not present

## 2018-07-31 DIAGNOSIS — M9903 Segmental and somatic dysfunction of lumbar region: Secondary | ICD-10-CM | POA: Diagnosis not present

## 2018-07-31 DIAGNOSIS — M9905 Segmental and somatic dysfunction of pelvic region: Secondary | ICD-10-CM | POA: Diagnosis not present

## 2018-07-31 DIAGNOSIS — M9902 Segmental and somatic dysfunction of thoracic region: Secondary | ICD-10-CM | POA: Diagnosis not present

## 2018-07-31 DIAGNOSIS — M5386 Other specified dorsopathies, lumbar region: Secondary | ICD-10-CM | POA: Diagnosis not present

## 2018-08-06 DIAGNOSIS — M9902 Segmental and somatic dysfunction of thoracic region: Secondary | ICD-10-CM | POA: Diagnosis not present

## 2018-08-06 DIAGNOSIS — M9905 Segmental and somatic dysfunction of pelvic region: Secondary | ICD-10-CM | POA: Diagnosis not present

## 2018-08-06 DIAGNOSIS — M9903 Segmental and somatic dysfunction of lumbar region: Secondary | ICD-10-CM | POA: Diagnosis not present

## 2018-08-06 DIAGNOSIS — M5386 Other specified dorsopathies, lumbar region: Secondary | ICD-10-CM | POA: Diagnosis not present

## 2018-08-07 DIAGNOSIS — M9905 Segmental and somatic dysfunction of pelvic region: Secondary | ICD-10-CM | POA: Diagnosis not present

## 2018-08-07 DIAGNOSIS — M5386 Other specified dorsopathies, lumbar region: Secondary | ICD-10-CM | POA: Diagnosis not present

## 2018-08-07 DIAGNOSIS — M9903 Segmental and somatic dysfunction of lumbar region: Secondary | ICD-10-CM | POA: Diagnosis not present

## 2018-08-07 DIAGNOSIS — M9902 Segmental and somatic dysfunction of thoracic region: Secondary | ICD-10-CM | POA: Diagnosis not present

## 2018-08-13 DIAGNOSIS — M5386 Other specified dorsopathies, lumbar region: Secondary | ICD-10-CM | POA: Diagnosis not present

## 2018-08-13 DIAGNOSIS — M9905 Segmental and somatic dysfunction of pelvic region: Secondary | ICD-10-CM | POA: Diagnosis not present

## 2018-08-13 DIAGNOSIS — M9902 Segmental and somatic dysfunction of thoracic region: Secondary | ICD-10-CM | POA: Diagnosis not present

## 2018-08-13 DIAGNOSIS — M9903 Segmental and somatic dysfunction of lumbar region: Secondary | ICD-10-CM | POA: Diagnosis not present

## 2018-08-19 DIAGNOSIS — M9902 Segmental and somatic dysfunction of thoracic region: Secondary | ICD-10-CM | POA: Diagnosis not present

## 2018-08-19 DIAGNOSIS — M9903 Segmental and somatic dysfunction of lumbar region: Secondary | ICD-10-CM | POA: Diagnosis not present

## 2018-08-19 DIAGNOSIS — M9905 Segmental and somatic dysfunction of pelvic region: Secondary | ICD-10-CM | POA: Diagnosis not present

## 2018-08-19 DIAGNOSIS — M5386 Other specified dorsopathies, lumbar region: Secondary | ICD-10-CM | POA: Diagnosis not present

## 2018-08-20 DIAGNOSIS — F411 Generalized anxiety disorder: Secondary | ICD-10-CM | POA: Diagnosis not present

## 2018-08-20 DIAGNOSIS — F338 Other recurrent depressive disorders: Secondary | ICD-10-CM | POA: Diagnosis not present

## 2018-08-20 DIAGNOSIS — Z79899 Other long term (current) drug therapy: Secondary | ICD-10-CM | POA: Diagnosis not present

## 2018-08-20 MED FILL — ADDERALL XR 20 MG CAP SA: 20 | 30 days supply | Qty: 30 | Fill #0

## 2018-08-20 MED FILL — DULoxetine HCL 60 MG CPEP: 60 | 30 days supply | Qty: 30 | Fill #0

## 2018-08-23 DIAGNOSIS — M9905 Segmental and somatic dysfunction of pelvic region: Secondary | ICD-10-CM | POA: Diagnosis not present

## 2018-08-23 DIAGNOSIS — M5386 Other specified dorsopathies, lumbar region: Secondary | ICD-10-CM | POA: Diagnosis not present

## 2018-08-23 DIAGNOSIS — M9902 Segmental and somatic dysfunction of thoracic region: Secondary | ICD-10-CM | POA: Diagnosis not present

## 2018-08-23 DIAGNOSIS — M9903 Segmental and somatic dysfunction of lumbar region: Secondary | ICD-10-CM | POA: Diagnosis not present

## 2018-09-02 DIAGNOSIS — Z01419 Encounter for gynecological examination (general) (routine) without abnormal findings: Secondary | ICD-10-CM | POA: Diagnosis not present

## 2018-09-02 DIAGNOSIS — Z6826 Body mass index (BMI) 26.0-26.9, adult: Secondary | ICD-10-CM | POA: Diagnosis not present

## 2018-09-26 MED FILL — ADDERALL XR 20 MG CAP SA: 20 | 30 days supply | Qty: 30 | Fill #0

## 2018-09-30 MED FILL — DULoxetine HCL 60 MG CPEP: 60 | 30 days supply | Qty: 30 | Fill #1

## 2018-11-04 MED FILL — DULoxetine HCL 60 MG CPEP: 60 | 30 days supply | Qty: 30 | Fill #2

## 2018-11-05 MED FILL — ADDERALL XR 20 MG CAP SA: 20 | 30 days supply | Qty: 30 | Fill #0

## 2018-12-15 ENCOUNTER — Ambulatory Visit: Payer: Self-pay | Admitting: Nurse Practitioner

## 2018-12-15 ENCOUNTER — Encounter: Payer: Self-pay | Admitting: Nurse Practitioner

## 2018-12-15 VITALS — BP 108/70 | HR 102 | Temp 98.5°F | Wt 142.0 lb

## 2018-12-15 DIAGNOSIS — J101 Influenza due to other identified influenza virus with other respiratory manifestations: Secondary | ICD-10-CM

## 2018-12-15 DIAGNOSIS — R509 Fever, unspecified: Secondary | ICD-10-CM

## 2018-12-15 LAB — POCT INFLUENZA A/B
INFLUENZA A, POC: POSITIVE — AB
INFLUENZA B, POC: NEGATIVE

## 2018-12-15 MED ORDER — OSELTAMIVIR PHOSPHATE 75 MG PO CAPS: 75.0000 mg | ORAL_CAPSULE | Freq: Two times a day (BID) | ORAL | 0 refills | Status: AC

## 2018-12-15 MED ORDER — PSEUDOEPH-BROMPHEN-DM 30-2-10 MG/5ML PO SYRP: 5.0000 mL | ORAL_SOLUTION | Freq: Four times a day (QID) | ORAL | 0 refills | Status: AC | PRN

## 2018-12-15 NOTE — Patient Instructions (Signed)
Influenza, Adult -Take medication as prescribed.   -Ibuprofen 800mg  every 8 hours for the next 48 hours.  Take this medication with food and water to protect the stomach lining. -Increase fluids. -Get plenty of rest. -Drink pineapple juice to help with cough. -Sleep elevated on at least 2 pillows at bedtime to help with cough. -Use a humidifier or vaporizer when at home and during sleep to help with cough. -May use a teaspoon of honey or over-the-counter cough drops to help with cough. -Follow-up if symptoms do not improve. -Return to our office for an influenza vaccine.  Influenza, more commonly known as "the flu," is a viral infection that mainly affects the respiratory tract. The respiratory tract includes organs that help you breathe, such as the lungs, nose, and throat. The flu causes many symptoms similar to the common cold along with high fever and body aches. The flu spreads easily from person to person (is contagious). Getting a flu shot (influenza vaccination) every year is the best way to prevent the flu. What are the causes? This condition is caused by the influenza virus. You can get the virus by:  Breathing in droplets that are in the air from an infected person's cough or sneeze.  Touching something that has been exposed to the virus (has been contaminated) and then touching your mouth, nose, or eyes. What increases the risk? The following factors may make you more likely to get the flu:  Not washing or sanitizing your hands often.  Having close contact with many people during cold and flu season.  Touching your mouth, eyes, or nose without first washing or sanitizing your hands.  Not getting a yearly (annual) flu shot. You may have a higher risk for the flu, including serious problems such as a lung infection (pneumonia), if you:  Are older than 65.  Are pregnant.  Have a weakened disease-fighting system (immune system). You may have a weakened immune system if  you: ? Have HIV or AIDS. ? Are undergoing chemotherapy. ? Are taking medicines that reduce (suppress) the activity of your immune system.  Have a long-term (chronic) illness, such as heart disease, kidney disease, diabetes, or lung disease.  Have a liver disorder.  Are severely overweight (morbidly obese).  Have anemia. This is a condition that affects your red blood cells.  Have asthma. What are the signs or symptoms? Symptoms of this condition usually begin suddenly and last 4-14 days. They may include:  Fever and chills.  Headaches, body aches, or muscle aches.  Sore throat.  Cough.  Runny or stuffy (congested) nose.  Chest discomfort.  Poor appetite.  Weakness or fatigue.  Dizziness.  Nausea or vomiting. How is this diagnosed? This condition may be diagnosed based on:  Your symptoms and medical history.  A physical exam.  Swabbing your nose or throat and testing the fluid for the influenza virus. How is this treated? If the flu is diagnosed early, you can be treated with medicine that can help reduce how severe the illness is and how long it lasts (antiviral medicine). This may be given by mouth (orally) or through an IV. Taking care of yourself at home can help relieve symptoms. Your health care provider may recommend:  Taking over-the-counter medicines.  Drinking plenty of fluids. In many cases, the flu goes away on its own. If you have severe symptoms or complications, you may be treated in a hospital. Follow these instructions at home: Activity  Rest as needed and get plenty of  sleep.  Stay home from work or school as told by your health care provider. Unless you are visiting your health care provider, avoid leaving home until your fever has been gone for 24 hours without taking medicine. Eating and drinking  Take an oral rehydration solution (ORS). This is a drink that is sold at pharmacies and retail stores.  Drink enough fluid to keep your  urine pale yellow.  Drink clear fluids in small amounts as you are able. Clear fluids include water, ice chips, diluted fruit juice, and low-calorie sports drinks.  Eat bland, easy-to-digest foods in small amounts as you are able. These foods include bananas, applesauce, rice, lean meats, toast, and crackers.  Avoid drinking fluids that contain a lot of sugar or caffeine, such as energy drinks, regular sports drinks, and soda.  Avoid alcohol.  Avoid spicy or fatty foods. General instructions      Take over-the-counter and prescription medicines only as told by your health care provider.  Use a cool mist humidifier to add humidity to the air in your home. This can make it easier to breathe.  Cover your mouth and nose when you cough or sneeze.  Wash your hands with soap and water often, especially after you cough or sneeze. If soap and water are not available, use alcohol-based hand sanitizer.  Keep all follow-up visits as told by your health care provider. This is important. How is this prevented?   Get an annual flu shot. You may get the flu shot in late summer, fall, or winter. Ask your health care provider when you should get your flu shot.  Avoid contact with people who are sick during cold and flu season. This is generally fall and winter. Contact a health care provider if:  You develop new symptoms.  You have: ? Chest pain. ? Diarrhea. ? A fever.  Your cough gets worse.  You produce more mucus.  You feel nauseous or you vomit. Get help right away if:  You develop shortness of breath or difficulty breathing.  Your skin or nails turn a bluish color.  You have severe pain or stiffness in your neck.  You develop a sudden headache or sudden pain in your face or ear.  You cannot eat or drink without vomiting. Summary  Influenza, more commonly known as "the flu," is a viral infection that primarily affects your respiratory tract.  Symptoms of the flu usually  begin suddenly and last 4-14 days.  Getting an annual flu shot is the best way to prevent getting the flu.  Stay home from work or school as told by your health care provider. Unless you are visiting your health care provider, avoid leaving home until your fever has been gone for 24 hours without taking medicine.  Keep all follow-up visits as told by your health care provider. This is important. This information is not intended to replace advice given to you by your health care provider. Make sure you discuss any questions you have with your health care provider. Document Released: 10/27/2000 Document Revised: 04/17/2018 Document Reviewed: 04/17/2018 Elsevier Interactive Patient Education  2019 [employer].

## 2018-12-15 NOTE — Progress Notes (Signed)
Subjective:     Olivia Pruitt is a 38 y.o. female who presents for evaluation of influenza like symptoms. Symptoms include fevers up to 100.8 degrees, chills, dry cough, myalgias, sore throat and fatigue and have been present for 1 day.  The patient denies headache, abdominal pain, nausea, vomiting, diarrhea, ear drainage, or runny nose. She has tried to alleviate the symptoms with Dayquil and Nyquil with minimal relief. High risk factors for influenza complications: patient is a Marine scientist. The patient informed she has had an influenza vaccine this season.  Patient does admit to a history of gestational diabetes, but she informs this has been resolved since the birth of her child.  The following portions of the patient's history were reviewed and updated as appropriate: allergies, current medications and past medical history.  Past Medical History:  Diagnosis Date  . Adult ADHD   . Depression   . Diabetes mellitus without complication ([place])   . Headache     Review of Systems Constitutional: positive for chills, fatigue, fevers and malaise, negative for anorexia and sweats Eyes: negative Ears, nose, mouth, throat, and face: positive for nasal congestion, sore throat and bilateral ear pressure, sore throat with cough, negative for ear drainage, earaches and hoarseness Respiratory: positive for cough, negative for asthma, chronic bronchitis, dyspnea on exertion, sputum, stridor and wheezing Cardiovascular: negative Gastrointestinal: negative Neurological: negative     Objective:    BP 108/70 (BP Location: Right Arm, Patient Position: Sitting, Cuff Size: Normal)   Pulse (!) 107   Temp 98.5 F (36.9 C)   Wt 142 lb (64.4 kg)   LMP 11/17/2018   SpO2 98%   BMI 25.97 kg/m    Physical Exam Vitals signs reviewed.  Constitutional:      General: She is not in acute distress.    Comments: Appears uncomfortable  HENT:     Head: Normocephalic.     Comments: No maxillary or frontal sinus  pressure. Turbinates inflamed and swollen, moderate nasal congestion, no nasal drainage at this time    Right Ear: Tympanic membrane, ear canal and external ear normal.     Left Ear: Tympanic membrane, ear canal and external ear normal.     Nose: Congestion (moderate) present.     Mouth/Throat:     Lips: Pink.     Mouth: Mucous membranes are moist.     Pharynx: Uvula midline. Posterior oropharyngeal erythema present. No oropharyngeal exudate.     Tonsils: No tonsillar exudate. Swelling: 0 on the right. 0 on the left.  Eyes:     Pupils: Pupils are equal, round, and reactive to light.  Neck:     Musculoskeletal: Normal range of motion and neck supple. No neck rigidity or muscular tenderness.  Cardiovascular:     Rate and Rhythm: Regular rhythm. Tachycardia present.     Pulses: Normal pulses.     Heart sounds: Normal heart sounds.  Pulmonary:     Effort: Pulmonary effort is normal. No respiratory distress.     Breath sounds: Normal breath sounds. No stridor. No wheezing, rhonchi or rales.  Abdominal:     General: Abdomen is flat. There is no distension.     Tenderness: There is no abdominal tenderness.  Skin:    General: Skin is warm and dry.     Capillary Refill: Capillary refill takes less than 2 seconds.  Neurological:     General: No focal deficit present.     Mental Status: She is alert and oriented to person,  place, and time.     Cranial Nerves: No cranial nerve deficit.  Psychiatric:        Mood and Affect: Mood normal.        Thought Content: Thought content normal.    Oral hydration provided to the patient due to her increased HR prior to discharge. HR @ time of discharge: 102    Results for orders placed or performed in visit on 12/15/18 (from the past 24 hour(s))  POCT Influenza A/B     Status: Abnormal   Collection Time: 12/15/18 12:18 PM  Result Value Ref Range   Influenza A, POC Positive (A) Negative   Influenza B, POC Negative Negative   Assessment:     Influenza  A   Plan:   Exam findings, diagnosis etiology and medication use and indications reviewed with patient. Follow- Up and discharge instructions provided. No emergent/urgent issues found on exam.  Based on the patient's clinical presentation, symptoms, physical exam, and positive influenza a test, patient symptoms are congruent with that of viral etiology.  Patient would like to start Tamiflu.  We will also provide some symptomatic cough treatment to include Bromfed.  Patient was instructed to also continue symptomatic treatment to include antipyretics, increasing fluids, and getting plenty of rest.  Patient was instructed to stay home from work until she is fever free for at least 24 hours.  Patient informed she does not return to work until Saturday, February 8.  Patient was provided oral hydration while in our office due to her increased heart rate.  Responded at time of discharge her heart rate had dropped down to 102.  Felt comfortable discharging this patient to home at that time as other vitals were stable.  Responded patient education was provided. Patient verbalized understanding of information provided and agrees with plan of care (POC), all questions answered. The patient is advised to call or return to clinic if condition does not see an improvement in symptoms, or to seek the care of the closest emergency department if condition worsens with the above plan.   1. Fever, unspecified fever cause  - POCT Influenza A/B  2. Influenza A  - oseltamivir (TAMIFLU) 75 MG capsule; Take 1 capsule (75 mg total) by mouth 2 (two) times daily for 5 days.  Dispense: 10 capsule; Refill: 0 - brompheniramine-pseudoephedrine-DM 30-2-10 MG/5ML syrup; Take 5 mLs by mouth 4 (four) times daily as needed for up to 7 days.  Dispense: 150 mL; Refill: 0 -Take medication as prescribed.   -Ibuprofen 800mg  every 8 hours for the next 48 hours.  Take this medication with food and water to protect the stomach  lining. -Increase fluids. -Get plenty of rest. -Drink pineapple juice to help with cough. -Sleep elevated on at least 2 pillows at bedtime to help with cough. -Use a humidifier or vaporizer when at home and during sleep to help with cough. -May use a teaspoon of honey or over-the-counter cough drops to help with cough. -Follow-up if symptoms do not improve.

## 2018-12-17 ENCOUNTER — Telehealth: Payer: Self-pay | Admitting: Emergency Medicine

## 2018-12-17 NOTE — Telephone Encounter (Signed)
Spoke with patient doing so much better This was a follow up call from visit with [facility] in [place]

## 2018-12-18 MED FILL — DULoxetine HCL 60 MG CPEP: 60 | 30 days supply | Qty: 30 | Fill #3

## 2019-01-02 DIAGNOSIS — R41840 Attention and concentration deficit: Secondary | ICD-10-CM | POA: Diagnosis not present

## 2019-01-02 DIAGNOSIS — F411 Generalized anxiety disorder: Secondary | ICD-10-CM | POA: Diagnosis not present

## 2019-01-02 DIAGNOSIS — Z79899 Other long term (current) drug therapy: Secondary | ICD-10-CM | POA: Diagnosis not present

## 2019-01-02 DIAGNOSIS — F338 Other recurrent depressive disorders: Secondary | ICD-10-CM | POA: Diagnosis not present

## 2019-01-02 DIAGNOSIS — F902 Attention-deficit hyperactivity disorder, combined type: Secondary | ICD-10-CM | POA: Diagnosis not present

## 2019-01-08 MED FILL — ADDERALL XR 20 MG CAP SA: 20 | 30 days supply | Qty: 30 | Fill #0

## 2019-01-13 DIAGNOSIS — Z8261 Family history of arthritis: Secondary | ICD-10-CM | POA: Diagnosis not present

## 2019-01-13 DIAGNOSIS — M79641 Pain in right hand: Secondary | ICD-10-CM | POA: Diagnosis not present

## 2019-01-13 DIAGNOSIS — J302 Other seasonal allergic rhinitis: Secondary | ICD-10-CM | POA: Diagnosis not present

## 2019-01-13 DIAGNOSIS — Z0000 Encounter for general adult medical examination without abnormal findings: Secondary | ICD-10-CM | POA: Diagnosis not present

## 2019-01-13 DIAGNOSIS — M79642 Pain in left hand: Secondary | ICD-10-CM | POA: Diagnosis not present

## 2019-01-13 DIAGNOSIS — F988 Other specified behavioral and emotional disorders with onset usually occurring in childhood and adolescence: Secondary | ICD-10-CM | POA: Diagnosis not present

## 2019-01-13 MED FILL — MONTELUKAST SOD 10 MG TAB: 10 | 90 days supply | Qty: 90 | Fill #0

## 2019-01-17 MED FILL — DULoxetine HCL 60 MG CPEP: 60 | 30 days supply | Qty: 30 | Fill #4 | Status: TO

## 2019-02-20 MED FILL — DULOXETINE HCL 60 MG CPEP: 60 | 30 days supply | Qty: 30 | Fill #0

## 2019-02-20 MED FILL — ADDERALL XR 20 MG CAP SA: 20 | 30 days supply | Qty: 30 | Fill #0

## 2019-03-21 DIAGNOSIS — U071 COVID-19: Secondary | ICD-10-CM | POA: Diagnosis not present

## 2019-03-26 MED FILL — DULOXETINE HCL 60 MG CPEP: 60 | 30 days supply | Qty: 30 | Fill #1

## 2019-04-28 MED FILL — DULOXETINE HCL 60 MG CPEP: 60 | 30 days supply | Qty: 30 | Fill #2

## 2019-06-02 MED FILL — DULOXETINE HCL 60 MG CPEP: 60 | 30 days supply | Qty: 30 | Fill #3

## 2019-07-01 MED FILL — DULOXETINE HCL 60 MG CPEP: 60 | 30 days supply | Qty: 30 | Fill #4

## 2019-07-10 DIAGNOSIS — H52203 Unspecified astigmatism, bilateral: Secondary | ICD-10-CM | POA: Diagnosis not present

## 2019-07-31 MED FILL — DULOXETINE HCL 60 MG CPEP: 60 | 30 days supply | Qty: 30 | Fill #5

## 2019-09-02 MED FILL — DULOXETINE HCL 60 MG CPEP: 60 | 30 days supply | Qty: 30 | Fill #0

## 2019-09-10 DIAGNOSIS — F411 Generalized anxiety disorder: Secondary | ICD-10-CM | POA: Diagnosis not present

## 2019-09-10 DIAGNOSIS — Z79899 Other long term (current) drug therapy: Secondary | ICD-10-CM | POA: Diagnosis not present

## 2019-09-10 DIAGNOSIS — F338 Other recurrent depressive disorders: Secondary | ICD-10-CM | POA: Diagnosis not present

## 2019-09-10 DIAGNOSIS — F902 Attention-deficit hyperactivity disorder, combined type: Secondary | ICD-10-CM | POA: Diagnosis not present

## 2019-10-08 MED FILL — DULOXETINE HCL 60 MG CPEP: 60 | 90 days supply | Qty: 90 | Fill #0

## 2019-11-13 ENCOUNTER — Other Ambulatory Visit (HOSPITAL_COMMUNITY): Payer: Self-pay | Admitting: Orthopedic Surgery

## 2019-11-13 ENCOUNTER — Other Ambulatory Visit: Payer: Self-pay

## 2019-11-13 ENCOUNTER — Ambulatory Visit (HOSPITAL_COMMUNITY)
Admission: RE | Admit: 2019-11-13 | Discharge: 2019-11-13 | Disposition: A | Discharge status: Home / Self Care | Payer: 59 | Source: Ambulatory Visit | Attending: Orthopedic Surgery | Admitting: Orthopedic Surgery

## 2019-11-13 ENCOUNTER — Ambulatory Visit (HOSPITAL_COMMUNITY): Admission: RE | Admit: 2019-11-13 | Payer: Self-pay | Source: Ambulatory Visit

## 2019-11-13 DIAGNOSIS — M79606 Pain in leg, unspecified: Secondary | ICD-10-CM

## 2019-11-13 DIAGNOSIS — M7989 Other specified soft tissue disorders: Secondary | ICD-10-CM | POA: Insufficient documentation

## 2019-11-13 DIAGNOSIS — S86811A Strain of other muscle(s) and tendon(s) at lower leg level, right leg, initial encounter: Secondary | ICD-10-CM | POA: Diagnosis not present

## 2019-11-13 DIAGNOSIS — S86812A Strain of other muscle(s) and tendon(s) at lower leg level, left leg, initial encounter: Secondary | ICD-10-CM | POA: Diagnosis not present

## 2019-11-13 MED FILL — predniSONE 10 MG TABS: 10 | 12 days supply | Qty: 24 | Fill #0

## 2019-11-13 NOTE — Progress Notes (Signed)
Lower extremity venous has been completed.   Preliminary results in CV Proc.  Attempted to call results, no answer.  Abram Sander 11/13/2019 1:42 PM

## 2019-12-18 MED FILL — CHLORHEXIDINE 0.12% RINSE: 0.12 | 16 days supply | Qty: 473 | Fill #0

## 2019-12-18 MED FILL — NAPROXEN SODIUM 550 MG TABS: 550 | 6 days supply | Qty: 12 | Fill #0

## 2020-01-13 MED FILL — DULOXETINE HCL 60 MG CPEP: 60 | 90 days supply | Qty: 90 | Fill #1

## 2020-01-15 DIAGNOSIS — F3342 Major depressive disorder, recurrent, in full remission: Secondary | ICD-10-CM | POA: Diagnosis not present

## 2020-01-15 DIAGNOSIS — Z0000 Encounter for general adult medical examination without abnormal findings: Secondary | ICD-10-CM | POA: Diagnosis not present

## 2020-01-15 MED FILL — MONTELUKAST SOD 10 MG TAB: 10 | 90 days supply | Qty: 90 | Fill #0

## 2020-02-06 ENCOUNTER — Other Ambulatory Visit (HOSPITAL_COMMUNITY): Payer: Self-pay | Admitting: Radiology

## 2020-02-06 DIAGNOSIS — Z01419 Encounter for gynecological examination (general) (routine) without abnormal findings: Secondary | ICD-10-CM | POA: Diagnosis not present

## 2020-02-06 DIAGNOSIS — Z30432 Encounter for removal of intrauterine contraceptive device: Secondary | ICD-10-CM | POA: Diagnosis not present

## 2020-02-06 DIAGNOSIS — Z6827 Body mass index (BMI) 27.0-27.9, adult: Secondary | ICD-10-CM | POA: Diagnosis not present

## 2020-02-06 DIAGNOSIS — N393 Stress incontinence (female) (male): Secondary | ICD-10-CM | POA: Diagnosis not present

## 2020-02-06 DIAGNOSIS — R87619 Unspecified abnormal cytological findings in specimens from cervix uteri: Secondary | ICD-10-CM | POA: Insufficient documentation

## 2020-02-06 DIAGNOSIS — F32A Depression, unspecified: Secondary | ICD-10-CM | POA: Insufficient documentation

## 2020-02-06 MED FILL — ETONOGESTREL-ETHINYL ESTRAD: 0.12-0.015 | 84 days supply | Qty: 3 | Fill #0

## 2020-02-19 ENCOUNTER — Encounter: Payer: Self-pay | Admitting: Physical Therapy

## 2020-02-19 ENCOUNTER — Ambulatory Visit: Payer: 59 | Attending: Radiology | Admitting: Physical Therapy

## 2020-02-19 ENCOUNTER — Other Ambulatory Visit: Payer: Self-pay

## 2020-02-19 DIAGNOSIS — M6281 Muscle weakness (generalized): Secondary | ICD-10-CM | POA: Diagnosis not present

## 2020-02-19 DIAGNOSIS — N393 Stress incontinence (female) (male): Secondary | ICD-10-CM | POA: Diagnosis not present

## 2020-02-19 DIAGNOSIS — M62838 Other muscle spasm: Secondary | ICD-10-CM | POA: Diagnosis not present

## 2020-02-19 DIAGNOSIS — R279 Unspecified lack of coordination: Secondary | ICD-10-CM | POA: Diagnosis not present

## 2020-02-19 NOTE — Therapy (Signed)
[facility] [address], [place], [place], [address] Phone: [phone]   Fax:  [phone]  Physical Therapy Evaluation  Patient Details  Name: Olivia Pruitt MRN: [id number] Date of Birth: [birth year] Referring Provider (PT): Rubbie Battiest, NP   Encounter Date: 02/19/2020  PT End of Session - 02/19/20 1425    Visit Number  1    Date for PT Re-Evaluation  05/13/20    Authorization Type  UMR    PT Start Time  1400    PT Stop Time  1450    PT Time Calculation (min)  50 min    Activity Tolerance  Patient tolerated treatment well;No increased pain    Behavior During Therapy  WFL for tasks assessed/performed       Past Medical History:  Diagnosis Date  . Adult ADHD   . Depression   . Diabetes mellitus without complication ([place])   . Headache     Past Surgical History:  Procedure Laterality Date  . NO PAST SURGERIES      There were no vitals filed for this visit.   Subjective Assessment - 02/19/20 1404    Subjective  Patient has had urinary leakage since 2018 when she had her last child. The birth was vaginal. Patient wears 3 pads per day that are heavy.    Patient Stated Goals  stop leaking with exercise    Currently in Pain?  No/denies    Multiple Pain Sites  No         [facility] PT Assessment - 02/19/20 0001      Assessment   Medical Diagnosis  N39.3 Stress incontinence    Referring Provider (PT)  Rubbie Battiest, NP    Onset Date/Surgical Date  --   2018   Prior Therapy  in the past with [facility]      Precautions   Precautions  None      Restrictions   Weight Bearing Restrictions  No      Balance Screen   Has the patient fallen in the past 6 months  No    Has the patient had a decrease in activity level because of a fear of falling?   No    Is the patient reluctant to leave their home because of a fear of falling?   No      Home Film/video editor residence       Prior Function   Level of Independence  Independent    Vocation  Part time employment   12 hour days   Leisure  does burn [employer] camp      Cognition   Overall Cognitive Status  Within Functional Limits for tasks assessed      Posture/Postural Control   Posture/Postural Control  No significant limitations      ROM / Strength   AROM / PROM / Strength  AROM;PROM;Strength      Strength   Right Hip ABduction  4/5    Right Hip ADduction  4/5    Left Hip Extension  4-/5    Left Hip ABduction  4/5    Left Hip ADduction  4/5      Palpation   Palpation comment  decreased bucket handle motion of the lower rib cage                Objective measurements completed on examination: See above findings.    Pelvic Floor Special Questions - 02/19/20 0001  Prior Pregnancies  Yes    Number of Vaginal Deliveries  3    Any difficulty with labor and deliveries  Yes    Episiotomy Performed  Yes   had push alot   Diastasis Recti  2 fingers width  and reduces when she breathes out    Currently Sexually Active  No    Urinary Leakage  Yes    Pad use  3 thick pads during the day    Activities that cause leaking  Exercising;Other    Other activities that cause leaking  jump rope, sit ups with 30#, when exercising the core    Urinary frequency  15 minutes and depends on what she drinks    Fecal incontinence  No    Caffeine beverages  none    Prolapse  --   MD said still has a prolapse bladder, happened with 3rd kid   Pelvic Floor Internal Exam  Patient confirms identification and approves PT to assess pelvic floor and treatment    Exam Type  Vaginal    Palpation  decreased movement of the right side of the pelvic floor, tightness on the left lower levator ani, less contraction of the urethra sphincter    Strength  weak squeeze, no lift   1/5 anterior/ 2/5 for posterior      [facility] Adult PT Treatment/Exercise - 02/19/20 0001      Manual Therapy   Manual Therapy  Internal Pelvic Floor     Internal Pelvic Floor  soft tissue work to the pelvic floor to improve mobility and pelvic floor contraction             PT Education - 02/19/20 1508    Education Details  pelvic floor contraction in hookly with ball between kneees, breath out with jumping and hardest part of exercise    Person(s) Educated  Patient    Methods  Explanation;Verbal cues;Handout;Demonstration    Comprehension  Verbalized understanding;Returned demonstration       PT Short Term Goals - 02/19/20 1509      PT SHORT TERM GOAL #1   Title  independent with initial HEP    Time  4    Period  Weeks    Status  New    Target Date  03/18/20      PT SHORT TERM GOAL #2   Title  able to contract anterior and posterior aspect of the pelvic floor equally    Time  4    Period  Weeks    Status  New    Target Date  03/18/20      PT SHORT TERM GOAL #3   Title  understand how to breath out with the hardest part of the exercise to reduce the urinary leakage    Time  4    Period  Weeks    Status  New    Target Date  03/18/20        PT Long Term Goals - 02/19/20 1520      PT LONG TERM GOAL #1   Title  independent with advanced HEP    Time  12    Period  Weeks    Status  New    Target Date  05/13/20      PT LONG TERM GOAL #2   Title  urinary leakage with abdominal exercise is minimal to none due to increased pelvic floor strength and coordination of breath    Time  12    Period  Weeks  Status  New    Target Date  05/13/20      PT LONG TERM GOAL #3   Title  wears a light day pad just in case with working out at [employer] camp due to pelvic floor strength >/= 4/5    Time  12    Period  Weeks    Status  New    Target Date  05/13/20      PT LONG TERM GOAL #4   Title  able to adjust abdominal pressure with exercise to reduce the urinary leakage and pressure on her prolapse    Time  12    Period  Weeks    Status  New    Target Date  05/13/20      PT LONG TERM GOAL #5   Title  able to have  a bowel movement with minimal to no straining due to improve pelvic floor coordination and relaxation    Time  12    Period  Weeks    Status  New    Target Date  05/13/20             Plan - 02/19/20 1513    Clinical Impression Statement  Patient is a 39 year old female with urinary leakage with exercise since she had her last child vaginally on 04/04/2017. Patient had a vacuum extraction with the birth. Patient reports she will leak urine with curl-ups, jumping rope, and abdominal exercises while she is at [place]. Patient has a 2 finger width diastasis above and below the umbilicus. When patient breaths out with a curl-up the Diastasis will decrease. Pelvic floor strength anteriorly is 1/5 and posteriorly is 2/5. Patient has tightness of the right side of the pelvic floor. Patient has decreased rib movement posteriorly and inferiorly. Patient has weakness in her hips. Patient has difficulty with adjusting her abdominal pressure with exercise causing urinary leakage. Patient has a grade 2 prolapse and strains with bowel movemens due to constipation. Patient will benefit from skilled therapy to improve pelvic floor strength and coordination with breath to reduce her leakage    Personal Factors and Comorbidities  Comorbidity 2;Fitness    Comorbidities  vaginal delivery with vacuum extraction; prolpase    Examination-Activity Limitations  Continence;Toileting;Lift    Stability/Clinical Decision Making  Evolving/Moderate complexity    Clinical Decision Making  Low    Rehab Potential  Excellent    PT Frequency  1x / week    PT Duration  12 weeks    PT Treatment/Interventions  Biofeedback;Therapeutic activities;Therapeutic exercise;Neuromuscular re-education;Manual techniques;Patient/family education;Spinal Manipulations    PT Next Visit Plan  work on lumbar moving tissue anteriorly to reduce diastasis; rib cage mobility, engaging the core with exercise, manual work to the right pelvic  floor, toileting, abdominal massage    Consulted and Agree with Plan of Care  Patient       Patient will benefit from skilled therapeutic intervention in order to improve the following deficits and impairments:  Decreased coordination, Increased fascial restricitons, Decreased strength, Decreased activity tolerance  Visit Diagnosis: Muscle weakness (generalized) - Plan: PT plan of care cert/re-cert  Unspecified lack of coordination - Plan: PT plan of care cert/re-cert  Other muscle spasm - Plan: PT plan of care cert/re-cert  Stress incontinence (female) (female) - Plan: PT plan of care cert/re-cert     Problem List Patient Active Problem List   Diagnosis Date Noted  . Pregnancy 04/04/2017    Earlie Counts, PT 02/19/20 3:27  PM   [facility] [address], [place], [place], [address] Phone: [phone]   Fax:  [phone]  Name: Olivia Pruitt MRN: [id number] Date of Birth: 09-02-1981

## 2020-02-19 NOTE — Patient Instructions (Signed)
Adduction: Hip - Knees Together With Pelvic Floor (Hook-Lying)    Lie with hips and knees bent, towel roll between knees. Squeeze pelvic floor while pushing knees together. Hold for _5__ seconds. Rest for _5__ seconds. Fully relax the muscle.  Repeat _10__ times. Do __3_ times a day.   Copyright  VHI. All rights reserved.  Breath out with hardest part of exercise No weight with abdominal exercise  [facility] [address], [place], [place] [address] Phone # [phone] Fax [phone]

## 2020-03-09 ENCOUNTER — Other Ambulatory Visit: Payer: Self-pay

## 2020-03-09 ENCOUNTER — Encounter: Payer: Self-pay | Admitting: Physical Therapy

## 2020-03-09 ENCOUNTER — Ambulatory Visit: Payer: 59 | Admitting: Physical Therapy

## 2020-03-09 DIAGNOSIS — N393 Stress incontinence (female) (male): Secondary | ICD-10-CM | POA: Diagnosis not present

## 2020-03-09 DIAGNOSIS — R279 Unspecified lack of coordination: Secondary | ICD-10-CM | POA: Diagnosis not present

## 2020-03-09 DIAGNOSIS — M62838 Other muscle spasm: Secondary | ICD-10-CM | POA: Diagnosis not present

## 2020-03-09 DIAGNOSIS — M6281 Muscle weakness (generalized): Secondary | ICD-10-CM | POA: Diagnosis not present

## 2020-03-09 NOTE — Patient Instructions (Signed)
Access Code: [email or web address] D URL: [email or web address] Date: 03/09/2020 Prepared by: Earlie Counts  Program Notes 1. sitting breathout with hands around rib cage and bring downward then place hands on front of chest and as you breath out glide hands downward   Exercises Cross Legged Seat with Side Bend - 1 x daily - 7 x weekly - 1 sets - 2 reps Supine Hip Adduction Isometric with Ball - 2 x daily - 7 x weekly - 1 sets - 5-10 reps Mid-Thoracic Spine Mobilization with Taped Tennis Balls Shoulder Flexion at Wall - 1 x daily - 7 x weekly - 1 sets - 10 reps [facility] [address] [place], [place] [address] Phone # [phone] Fax [phone]

## 2020-03-09 NOTE — Therapy (Signed)
[facility] [address] [place], [place], [address] Phone: [phone]   Fax:  [phone]  Physical Therapy Treatment  Patient Details  Name: Olivia Pruitt MRN: [id number] Date of Birth: 1981-11-06 Referring Provider (PT): Rubbie Battiest, NP   Encounter Date: 03/09/2020  PT End of Session - 03/09/20 1359    Visit Number  2    Date for PT Re-Evaluation  05/13/20    Authorization Type  UMR    PT Start Time  1400    PT Stop Time  1438    PT Time Calculation (min)  38 min    Activity Tolerance  Patient tolerated treatment well;No increased pain    Behavior During Therapy  WFL for tasks assessed/performed       Past Medical History:  Diagnosis Date  . Adult ADHD   . Depression   . Diabetes mellitus without complication ([place])   . Headache     Past Surgical History:  Procedure Laterality Date  . NO PAST SURGERIES      There were no vitals filed for this visit.  Subjective Assessment - 03/09/20 1401    Subjective  I did no jump ropes with no accidents.    Patient Stated Goals  stop leaking with exercise    Currently in Pain?  No/denies                       [facility] Adult PT Treatment/Exercise - 03/09/20 0001      Neuro Re-ed    Neuro Re-ed Details   breathing with ball squeeze, pelvis floor contraaction and therapist assisting the upper and lower rib cage to move correctly      Lumbar Exercises: Stretches   Other Lumbar Stretch Exercise  standing lat stretch and to increase the thoracic    Other Lumbar Stretch Exercise  using a tennis ball to massage the thoracic and lumbar paraspinals to loose the muscles      Manual Therapy   Manual Therapy  Soft tissue mobilization;Joint mobilization;Myofascial release    Joint Mobilization  PA and rotationa mobilization to T4-L5; bil. rib mobilizaiton to bring downward    Soft tissue mobilization  lumbar paraspinals, obliques, lateral trunk, and  diaphragm and upper abdominal in quadruped,   prone and sidely    Myofascial Release  using the suction cups to pull the fascia from the lo thoracic and lumbar area             PT Education - 03/09/20 1441    Education Details  Access Code: [email or web address]    Person(s) Educated  Patient    Methods  Explanation;Demonstration;Handout    Comprehension  Verbalized understanding;Returned demonstration       PT Short Term Goals - 03/09/20 1445      PT SHORT TERM GOAL #1   Title  independent with initial HEP    Time  4    Period  Weeks    Status  On-going      PT SHORT TERM GOAL #2   Title  able to contract anterior and posterior aspect of the pelvic floor equally    Time  4    Period  Weeks    Status  On-going      PT SHORT TERM GOAL #3   Title  understand how to breath out with the hardest part of the exercise to reduce the urinary leakage    Time  4    Period  Weeks    Status  On-going        PT Long Term Goals - 02/19/20 1520      PT LONG TERM GOAL #1   Title  independent with advanced HEP    Time  12    Period  Weeks    Status  New    Target Date  05/13/20      PT LONG TERM GOAL #2   Title  urinary leakage with abdominal exercise is minimal to none due to increased pelvic floor strength and coordination of breath    Time  12    Period  Weeks    Status  New    Target Date  05/13/20      PT LONG TERM GOAL #3   Title  wears a light day pad just in case with working out at [employer] camp due to pelvic floor strength >/= 4/5    Time  12    Period  Weeks    Status  New    Target Date  05/13/20      PT LONG TERM GOAL #4   Title  able to adjust abdominal pressure with exercise to reduce the urinary leakage and pressure on her prolapse    Time  12    Period  Weeks    Status  New    Target Date  05/13/20      PT LONG TERM GOAL #5   Title  able to have a bowel movement with minimal to no straining due to improve pelvic floor coordination and relaxation    Time   12    Period  Weeks    Status  New    Target Date  05/13/20            Plan - 03/09/20 1359    Clinical Impression Statement  Urinary leakage is 30% better. Yesterday she was able to jump rope and had no leakage. Lower rib cage is outflared. Educated patient on how to depress her rib cage to have the external obliaues contract. Worked on increase lower rib cage mobility and mobilize the thoracic area. Patient reports less constipation with increased fiber and water.  Patient will benefit from skilled therapy to improve pelvic floor strength and coordination with breath to reduce her leakage.    Personal Factors and Comorbidities  Comorbidity 2;Fitness    Comorbidities  vaginal delivery with vacuum extraction; prolpase    Examination-Activity Limitations  Continence;Toileting;Lift    Stability/Clinical Decision Making  Evolving/Moderate complexity    Rehab Potential  Excellent    PT Frequency  1x / week    PT Duration  12 weeks    PT Treatment/Interventions  Biofeedback;Therapeutic activities;Therapeutic exercise;Neuromuscular re-education;Manual techniques;Patient/family education;Spinal Manipulations    PT Next Visit Plan  work on pelvic floor contraction, toileting, rib cage mobility, hookly with leg and arm movement with abdominals contracted    PT Home Exercise Plan  Access Code: [email or web address]    Consulted and Agree with Plan of Care  Patient       Patient will benefit from skilled therapeutic intervention in order to improve the following deficits and impairments:  Decreased coordination, Increased fascial restricitons, Decreased strength, Decreased activity tolerance  Visit Diagnosis: Muscle weakness (generalized)  Unspecified lack of coordination  Other muscle spasm  Stress incontinence (female) (female)     Problem List Patient Active Problem List   Diagnosis Date Noted  . Pregnancy 04/04/2017    Earlie Counts, PT 03/09/20 2:46  PM   [facility] [address], [place], [place], [address] Phone: [phone]   Fax:  [phone]  Name: Olivia Pru[address] MRN: [id number] Date of Birth: 08-13-1981

## 2020-03-18 ENCOUNTER — Encounter: Payer: Self-pay | Admitting: Physical Therapy

## 2020-03-18 ENCOUNTER — Other Ambulatory Visit: Payer: Self-pay

## 2020-03-18 ENCOUNTER — Ambulatory Visit: Payer: 59 | Attending: Radiology | Admitting: Physical Therapy

## 2020-03-18 DIAGNOSIS — R279 Unspecified lack of coordination: Secondary | ICD-10-CM | POA: Insufficient documentation

## 2020-03-18 DIAGNOSIS — M6281 Muscle weakness (generalized): Secondary | ICD-10-CM | POA: Insufficient documentation

## 2020-03-18 DIAGNOSIS — M62838 Other muscle spasm: Secondary | ICD-10-CM | POA: Insufficient documentation

## 2020-03-18 DIAGNOSIS — N393 Stress incontinence (female) (male): Secondary | ICD-10-CM | POA: Diagnosis not present

## 2020-03-18 NOTE — Therapy (Signed)
[facility] [address], [place], [place], [address] Phone: [phone]   Fax:  [phone]  Physical Therapy Treatment  Patient Details  Name: Olivia Pruitt MRN: [id number] Date of Birth: 10/11/1981 Referring Provider (PT): Rubbie Battiest, NP   Encounter Date: 03/18/2020  PT End of Session - 03/18/20 1536    Visit Number  3    Date for PT Re-Evaluation  05/13/20    Authorization Type  UMR    PT Start Time  [id number]    PT Stop Time  1608    PT Time Calculation (min)  38 min    Activity Tolerance  Patient tolerated treatment well;No increased pain    Behavior During Therapy  WFL for tasks assessed/performed       Past Medical History:  Diagnosis Date  . Adult ADHD   . Depression   . Diabetes mellitus without complication ([place])   . Headache     Past Surgical History:  Procedure Laterality Date  . NO PAST SURGERIES      There were no vitals filed for this visit.  Subjective Assessment - 03/18/20 1535    Subjective  Leakage is improving. I did alot of jumping but started to leak over time.    Patient Stated Goals  stop leaking with exercise    Currently in Pain?  No/denies    Multiple Pain Sites  No                    Pelvic Floor Special Questions - 03/18/20 0001    Diastasis Recti  1.5 finger width after workout        [facility] Adult PT Treatment/Exercise - 03/18/20 0001      Neuro Re-ed    Neuro Re-ed Details   when she is moving her legs in supine to contract the abdominal instead of bulging out.       Lumbar Exercises: Supine   Heel Slides  20 reps    Heel Slides Limitations  with abdominal bracing, feet onsliders    Other Supine Lumbar Exercises  alternate shoulder flexion with abdominal contraction      Knee/Hip Exercises: Sidelying   Clams  reverse clam with yellow band, pelvic floor contraction and abdominal bracing      Manual Therapy   Manual Therapy  Soft tissue  mobilization;Myofascial release    Soft tissue mobilization  circular massage to abdomen to promote peristalic motion of the intestines; scar massage at the c-section scar    Myofascial Release  fascial release around the bladder, mesenteric root, and suprapubically and pubovesical ligament             PT Education - 03/18/20 1610    Education Details  Access Code: [email or web address]; toileting technique; abdominal massage    Person(s) Educated  Patient    Methods  Explanation;Demonstration;Verbal cues;Handout    Comprehension  Verbalized understanding;Returned demonstration       PT Short Term Goals - 03/18/20 1615      PT SHORT TERM GOAL #1   Title  independent with initial HEP    Time  4    Period  Weeks    Status  Achieved      PT SHORT TERM GOAL #2   Title  able to contract anterior and posterior aspect of the pelvic floor equally    Time  4    Period  Weeks    Status  On-going      PT  SHORT TERM GOAL #3   Title  understand how to breath out with the hardest part of the exercise to reduce the urinary leakage    Time  4    Period  Weeks    Status  Achieved        PT Long Term Goals - 02/19/20 1520      PT LONG TERM GOAL #1   Title  independent with advanced HEP    Time  12    Period  Weeks    Status  New    Target Date  05/13/20      PT LONG TERM GOAL #2   Title  urinary leakage with abdominal exercise is minimal to none due to increased pelvic floor strength and coordination of breath    Time  12    Period  Weeks    Status  New    Target Date  05/13/20      PT LONG TERM GOAL #3   Title  wears a light day pad just in case with working out at [employer] camp due to pelvic floor strength >/= 4/5    Time  12    Period  Weeks    Status  New    Target Date  05/13/20      PT LONG TERM GOAL #4   Title  able to adjust abdominal pressure with exercise to reduce the urinary leakage and pressure on her prolapse    Time  12    Period  Weeks    Status  New    Target  Date  05/13/20      PT LONG TERM GOAL #5   Title  able to have a bowel movement with minimal to no straining due to improve pelvic floor coordination and relaxation    Time  12    Period  Weeks    Status  New    Target Date  05/13/20            Plan - 03/18/20 1536    Clinical Impression Statement  Patient reports she is leaking less. Patient will leak after continous jump rope due to fatique. Fascial restrictions in lower abdomen where she had her c-section scar. Patient needed tactile cues to depress her rib cage with abdominal contraction and no bulging. After patient was able to perfrom correctly she was able to move her arms alternately then legs. Patient understands how to toilet correctly and abdominal massage. Patient will benefit from skilled therapy to improve pelvic floor strength and coordination with breath to reduce her leakage.    Personal Factors and Comorbidities  Comorbidity 2;Fitness    Comorbidities  vaginal delivery with vacuum extraction; prolpase    Examination-Activity Limitations  Continence;Toileting;Lift    Stability/Clinical Decision Making  Evolving/Moderate complexity    Rehab Potential  Excellent    PT Frequency  1x / week    PT Duration  12 weeks    PT Treatment/Interventions  Biofeedback;Therapeutic activities;Therapeutic exercise;Neuromuscular re-education;Manual techniques;Patient/family education;Spinal Manipulations    PT Next Visit Plan  internal work to see about circular contraction, lower abdominal fascial work, quadruped and 1/2 kneel abdominal exercise    PT Home Exercise Plan  Access Code: [email or web address]    Consulted and Agree with Plan of Care  Patient       Patient will benefit from skilled therapeutic intervention in order to improve the following deficits and impairments:  Decreased coordination, Increased fascial restricitons, Decreased strength, Decreased activity tolerance  Visit  Diagnosis: Muscle weakness (generalized)  Unspecified  lack of coordination  Other muscle spasm  Stress incontinence (female) (female)     Problem List Patient Active Problem List   Diagnosis Date Noted  . Pregnancy 04/04/2017    Earlie Counts, PT 03/18/20 4:17 PM   [facility] [address], [place], [place], [address] Phone: [phone]   Fax:  [phone]  Name: Olivia [address] MRN: [id number] Date of Birth: [birth year]

## 2020-03-18 NOTE — Patient Instructions (Addendum)
Toileting Techniques for Bowel Movements    An Evacuation/Defecation Plan   Here are the 4 basic points:  1. Lean forward enough for your elbows to rest on your knees 2. Support your feet on the floor or use a low stool if your feet don't touch the floor  3. Push out your belly as if you have swallowed a beach ball--you should feel a widening of your waist. "Belly Big, Belly Hard" 4. Open and relax your pelvic floor muscles, rather than tightening around the anus  While you are sitting on the toilet pay attention to the following areas: . Jaw and mouth position- relaxed not clenched . Angle of your hips - leaning slightly forward . Whether your feet touch the ground or not - should be flat and supported . Arm placement - rest against your thighs . Spine position - flat back . Waist . Breathing - exhale as you push (like blowing up a balloon or try using other sounds such as ahhhh, shhhhh, ohhhh or grrrrrrr) . Belly - hard and tight as you push . Anus (opening of the anal canal) - relaxed and open as you push . Anus - Tighten and lift pulling the muscle back in after you are done or if taking a break  If you are not successful after 10-15 minutes, try again later.  Avoid negative self-talk about your toileting experience.   Read this for more details and ask your PT if you need suggestions for adjustments or limitations:  1) Sitting on the toilet  a) Make sure your feet are supported - flat on the floor or step stool b) Many people find it effective to lean forward or raise their knees.  Propping your feet on a step stool (squatty potty is a brand name) can help the muscles around the anus to relax  c) When you lean forward, place your forearms on your thighs for support  2) Relaxing a) Breathe deeply and slowly in through your nose and out through your mouth. b) To become aware of how to relax your muscles, contracting and releasing muscles can be helpful.  Pull your pelvic floor  muscles in tightly by using the image of holding back gas, or closing around the anus (visualize making a circle smaller) and lifting the anus up and in.  Then release the muscles and your anus should drop down and feel open. Repeat 5 times ending with the feeling of relaxation. c) Keep your pelvic floor muscles relaxed; let your belly bulge out. d) The digestive tract starts at the mouth and ends at the anal opening, so be sure to relax both ends of the tube.  Place your tongue on the roof of your mouth with your teeth separated.  This helps relax your mouth and will help to relax the anus at the same time.  3) Emptying (defecation) a) Keep your pelvic floor and sphincter relaxed, then bulge your anal muscles.  Make the anal opening wide.  b) Stick your belly out as if you have swallowed a beach ball. c) Make your belly wall hard using your belly muscles while continuing to breathe. Doing this makes it easier to open your anus. d) Breath out and give a grunt (or try using other sounds such as ahhhh, shhhhh, ohhhh or grrrrrrr). e)  Can also try to act as if you are blowing up a balloon as you push  4) Finishing a) As you finish your bowel movement, pull the pelvic floor muscles   up and in.  This will leave your anus in the proper place rather than remaining pushed out and down. If you leave your anus pushed out and down, it will start to feel as though that is normal and give you incorrect signals about needing to have a bowel movement.  About Abdominal Massage  Abdominal massage, also called external colon massage, is a self-treatment circular massage technique that can reduce and eliminate gas and ease constipation. The colon naturally contracts in waves in a clockwise direction starting from inside the right hip, moving up toward the ribs, across the belly, and down inside the left hip.  When you perform circular abdominal massage, you help stimulate your colon's normal wave pattern of movement  called peristalsis.  It is most beneficial when done after eating.  Positioning You can practice abdominal massage with oil while lying down, or in the shower with soap.  Some people find that it is just as effective to do the massage through clothing while sitting or standing.  How to Massage Start by placing your finger tips or knuckles on your right side, just inside your hip bone.  . Make small circular movements while you move upward toward your rib cage.   . Once you reach the bottom right side of your rib cage, take your circular movements across to the left side of the bottom of your rib cage.  . Next, move downward until you reach the inside of your left hip bone.  This is the path your feces travel in your colon. . Continue to perform your abdominal massage in this pattern for 10 minutes each day.     You can apply as much pressure as is comfortable in your massage.  Start gently and build pressure as you continue to practice.  Notice any areas of pain as you massage; areas of slight pain may be relieved as you massage, but if you have areas of significant or intense pain, consult with your healthcare provider.  Other Considerations . General physical activity including bending and stretching can have a beneficial massage-like effect on the colon.  Deep breathing can also stimulate the colon because breathing deeply activates the same nervous system that supplies the colon.   . Abdominal massage should always be used in combination with a bowel-conscious diet that is high in the proper type of fiber for you, fluids (primarily water), and a regular exercise program.  [facility] [address] [place],  [address] Phone # [phone] Fax [phone]  Access Code: [email or web address] URL: [email or web address] Date: 03/09/2020 Prepared by: Earlie Counts  Program Notes 1. sitting breathout with hands around rib cage and bring downward then place  hands on front of chest and as you breath out glide hands downward   Exercises Cross Legged Seat with Side Bend - 1 x daily - 7 x weekly - 1 sets - 2 reps Supine Hip Adduction Isometric with Ball - 2 x daily - 7 x weekly - 1 sets - 5-10 reps Mid-Thoracic Spine Mobilization with Taped Tennis Balls Shoulder Flexion at Wall - 1 x daily - 7 x weekly - 1 sets - 10 reps Supine Alternating Shoulder Flexion - 1 x daily - 7 x weekly - 3 sets - 10 reps Supine Heel Slide with Small Ball - 1 x daily - 7 x weekly - 2 sets - 10 reps Sidelying Reverse Clamshell with Resistance - 1 x daily - 7 x weekly - 1 sets - 10 reps

## 2020-03-21 DIAGNOSIS — H1013 Acute atopic conjunctivitis, bilateral: Secondary | ICD-10-CM | POA: Diagnosis not present

## 2020-03-24 ENCOUNTER — Ambulatory Visit: Payer: 59 | Admitting: Physical Therapy

## 2020-03-25 ENCOUNTER — Encounter: Payer: Self-pay | Admitting: Physical Therapy

## 2020-03-31 ENCOUNTER — Ambulatory Visit: Payer: 59 | Admitting: Physical Therapy

## 2020-04-05 MED FILL — DULOXETINE HCL 60 MG CPEP: 60 | 90 days supply | Qty: 90 | Fill #2

## 2020-04-05 MED FILL — MONTELUKAST SOD 10 MG TAB: 10 | 90 days supply | Qty: 90 | Fill #1

## 2020-04-07 ENCOUNTER — Other Ambulatory Visit: Payer: Self-pay

## 2020-04-07 ENCOUNTER — Ambulatory Visit: Payer: 59 | Admitting: Physical Therapy

## 2020-04-07 ENCOUNTER — Encounter: Payer: Self-pay | Admitting: Physical Therapy

## 2020-04-07 DIAGNOSIS — R279 Unspecified lack of coordination: Secondary | ICD-10-CM | POA: Diagnosis not present

## 2020-04-07 DIAGNOSIS — N393 Stress incontinence (female) (male): Secondary | ICD-10-CM | POA: Diagnosis not present

## 2020-04-07 DIAGNOSIS — M62838 Other muscle spasm: Secondary | ICD-10-CM

## 2020-04-07 DIAGNOSIS — M6281 Muscle weakness (generalized): Secondary | ICD-10-CM | POA: Diagnosis not present

## 2020-04-07 NOTE — Therapy (Addendum)
[facility] [address], [place], [place], [address] Phone: [phone]   Fax:  [phone]  Physical Therapy Treatment  Patient Details  Name: Olivia Pruitt MRN: [id number] Date of Birth: [birth year] Referring Provider (PT): Rubbie Battiest, NP   Encounter Date: 04/07/2020  PT End of Session - 04/07/20 1017    Visit Number  4    Date for PT Re-Evaluation  05/13/20    Authorization Type  [facility]    PT Start Time  1015    PT Stop Time  1055    PT Time Calculation (min)  40 min    Activity Tolerance  Patient tolerated treatment well;No increased pain    Behavior During Therapy  WFL for tasks assessed/performed       Past Medical History:  Diagnosis Date  . Adult ADHD   . Depression   . Diabetes mellitus without complication ([place])   . Headache     Past Surgical History:  Procedure Laterality Date  . NO PAST SURGERIES      There were no vitals filed for this visit.  Subjective Assessment - 04/07/20 1019    Subjective  Things are much better.    Patient Stated Goals  stop leaking with exercise    Currently in Pain?  No/denies                        [facility] Adult PT Treatment/Exercise - 04/07/20 0001      Lumbar Exercises: Stretches   Other Lumbar Stretch Exercise  lay on bolster along spine to stretch the pectoralis then alternate shoulder and hip flexion; lay with foam roll along the thoracic spine to work on extension      Lumbar Exercises: Aerobic   Stationary Bike  level 4 for 5 minutes while assessing patient    Other Aerobic Exercise  footwork through ladder with pelvic floor contraction; ; hop in and out of ladder with no leakage      Lumbar Exercises: Standing   Other Standing Lumbar Exercises  single leg squat with pelvic floor contraction       Lumbar Exercises: Supine   Advanced Lumbar Stabilization Limitations  long sit with going backwards and forwards with ball behind her back  and tactile cues to engage the abdoinnals    Other Supine Lumbar Exercises  straight v abdoinal with ball between knees then do side to side with pelvic floor contraction    Other Supine Lumbar Exercises  reclined with alternate hip flexion with core and pelvic floor contracted; roll on mat and pop up on floor with pelvic floor contraction             PT Education - 04/07/20 1057    Education Details  education on how to modifiy her work out at [employer] to engage her core correctly    [employer]) Educated  Patient    Methods  Explanation;Demonstration    Comprehension  Verbalized understanding;Returned demonstration       PT Short Term Goals - 03/18/20 1615      PT SHORT TERM GOAL #1   Title  independent with initial HEP    Time  4    Period  Weeks    Status  Achieved      PT SHORT TERM GOAL #2   Title  able to contract anterior and posterior aspect of the pelvic floor equally    Time  4    Period  Weeks    Status  On-going      PT SHORT TERM GOAL #3   Title  understand how to breath out with the hardest part of the exercise to reduce the urinary leakage    Time  4    Period  Weeks    Status  Achieved        PT Long Term Goals - 04/07/20 1101      PT LONG TERM GOAL #1   Title  independent with advanced HEP    Time  12    Period  Weeks    Status  On-going      PT LONG TERM GOAL #2   Title  urinary leakage with abdominal exercise is minimal to none due to increased pelvic floor strength and coordination of breath    Time  12    Period  Weeks    Status  On-going      PT LONG TERM GOAL #3   Title  wears a light day pad just in case with working out at [employer] camp due to pelvic floor strength >/= 4/5    Time  12    Period  Weeks    Status  On-going      PT LONG TERM GOAL #4   Title  able to adjust abdominal pressure with exercise to reduce the urinary leakage and pressure on her prolapse    Time  12    Period  Weeks    Status  Achieved      PT  LONG TERM GOAL #5   Title  able to have a bowel movement with minimal to no straining due to improve pelvic floor coordination and relaxation    Time  12    Period  Weeks    Status  On-going            Plan - 04/07/20 1057    Clinical Impression Statement  Patient reports her urinary leakage is 75% better. She will leak more with sudden falls with workout or high level exercises. Patient understands on how to not bulge her abdomen instead of contracting to hollow our the abdomen. Patient did not leak urine with her workout today and able to engage the core correctly. Patient will benefit from skilled therapy to improve pelvic floor strength and coordination with breath to reduce her leakage.    Personal Factors and Comorbidities  Comorbidity 2;Fitness    Comorbidities  vaginal delivery with vacuum extraction; prolpase    Examination-Activity Limitations  Continence;Toileting;Lift    Stability/Clinical Decision Making  Evolving/Moderate complexity    Rehab Potential  Excellent    PT Frequency  1x / week    PT Duration  12 weeks    PT Treatment/Interventions  Biofeedback;Therapeutic activities;Therapeutic exercise;Neuromuscular re-education;Manual techniques;Patient/family education;Spinal Manipulations    PT Next Visit Plan  advanced HEP for core with her exercises at the gym, if doing well then discharge    PT Home Exercise Plan  Access Code: [email or web address]    Consulted and Agree with Plan of Care  Patient       Patient will benefit from skilled therapeutic intervention in order to improve the following deficits and impairments:  Decreased coordination, Increased fascial restricitons, Decreased strength, Decreased activity tolerance  Visit Diagnosis: Muscle weakness (generalized)  Unspecified lack of coordination  Other muscle spasm  Stress incontinence (female) (female)     Problem List Patient Active Problem List   Diagnosis Date Noted  . Pregnancy 04/04/2017  Earlie Counts, PT 04/07/20 11:02 AM    [facility] [address] [place], [place], [address] Phone: [phone]   Fax:  [phone]  Name: Olivia Pruitt MRN: [id number] Date of Birth: 12/22/1980  PHYSICAL THERAPY DISCHARGE SUMMARY  Visits from Start of Care: 4  Current functional level related to goals / functional outcomes: See above. Patient has no-showed for her appointment on 05/05/2020.    Remaining deficits: See above.   Education / Equipment: HEP  Plan: Patient agrees to discharge.  Patient goals were not met. Patient is being discharged due to not returning since the last visit. Thank you for the referral. Earlie Counts, PT 05/05/20 10:40 AM   ?????

## 2020-04-21 ENCOUNTER — Ambulatory Visit: Payer: 59 | Admitting: Physical Therapy

## 2020-04-22 ENCOUNTER — Ambulatory Visit: Payer: 59 | Attending: Radiology | Admitting: Physical Therapy

## 2020-04-22 ENCOUNTER — Telehealth: Payer: Self-pay | Admitting: Physical Therapy

## 2020-04-22 NOTE — Telephone Encounter (Signed)
Called patient about her visit she missed today at 10:15. Left message for patient to call back.  Earlie Counts, PT @6 /08/2020@ 10:42 AM

## 2020-04-26 MED FILL — ETONOGESTREL-ETHINYL ESTRAD: 0.12-0.015 | 84 days supply | Qty: 3 | Fill #1

## 2020-05-04 DIAGNOSIS — M25571 Pain in right ankle and joints of right foot: Secondary | ICD-10-CM | POA: Diagnosis not present

## 2020-05-04 MED FILL — DICLOFENAC SODIUM 75 MG TAB: 75 | 30 days supply | Qty: 60 | Fill #0

## 2020-05-05 ENCOUNTER — Ambulatory Visit: Payer: 59 | Admitting: Physical Therapy

## 2020-05-05 ENCOUNTER — Telehealth: Payer: Self-pay | Admitting: Physical Therapy

## 2020-05-05 NOTE — Telephone Encounter (Signed)
Called patient about her no-show appointment today at 10:15. Left a message.  Earlie Counts, PT @6 /23/2021@ 10:36 AM

## 2020-05-10 DIAGNOSIS — M79674 Pain in right toe(s): Secondary | ICD-10-CM | POA: Diagnosis not present

## 2020-05-12 DIAGNOSIS — M79674 Pain in right toe(s): Secondary | ICD-10-CM | POA: Diagnosis not present

## 2020-05-26 ENCOUNTER — Encounter: Payer: 59 | Admitting: Physical Therapy

## 2020-07-23 MED FILL — ETONOGESTREL-ETHINYL ESTRAD: 0.12-0.015 | 84 days supply | Qty: 3 | Fill #2

## 2020-07-23 MED FILL — DULOXETINE HCL 60 MG CPEP: 60 | 90 days supply | Qty: 90 | Fill #3

## 2020-08-13 DIAGNOSIS — Z20828 Contact with and (suspected) exposure to other viral communicable diseases: Secondary | ICD-10-CM | POA: Diagnosis not present

## 2020-08-18 ENCOUNTER — Other Ambulatory Visit (HOSPITAL_COMMUNITY): Payer: Self-pay | Admitting: General Surgery

## 2020-08-19 ENCOUNTER — Ambulatory Visit (HOSPITAL_COMMUNITY)
Admission: EM | Admit: 2020-08-19 | Discharge: 2020-08-20 | Disposition: A | Discharge status: Home / Self Care | Payer: 59 | Attending: General Surgery | Admitting: General Surgery

## 2020-08-19 ENCOUNTER — Encounter (HOSPITAL_COMMUNITY): Admission: EM | Disposition: A | Discharge status: Home / Self Care | Payer: Self-pay | Source: Home / Self Care

## 2020-08-19 ENCOUNTER — Emergency Department (HOSPITAL_COMMUNITY): Payer: 59 | Admitting: Registered Nurse

## 2020-08-19 DIAGNOSIS — Z20822 Contact with and (suspected) exposure to covid-19: Secondary | ICD-10-CM | POA: Insufficient documentation

## 2020-08-19 DIAGNOSIS — Z888 Allergy status to other drugs, medicaments and biological substances status: Secondary | ICD-10-CM | POA: Diagnosis not present

## 2020-08-19 DIAGNOSIS — L7622 Postprocedural hemorrhage and hematoma of skin and subcutaneous tissue following other procedure: Secondary | ICD-10-CM | POA: Diagnosis not present

## 2020-08-19 DIAGNOSIS — F909 Attention-deficit hyperactivity disorder, unspecified type: Secondary | ICD-10-CM | POA: Diagnosis not present

## 2020-08-19 DIAGNOSIS — E119 Type 2 diabetes mellitus without complications: Secondary | ICD-10-CM | POA: Diagnosis not present

## 2020-08-19 DIAGNOSIS — Z885 Allergy status to narcotic agent status: Secondary | ICD-10-CM | POA: Diagnosis not present

## 2020-08-19 DIAGNOSIS — F32A Depression, unspecified: Secondary | ICD-10-CM | POA: Diagnosis not present

## 2020-08-19 HISTORY — PX: ABDOMINOPLASTY: SHX5355

## 2020-08-19 LAB — COMPREHENSIVE METABOLIC PANEL
ALT: 19 U/L (ref 0–44)
AST: 20 U/L (ref 15–41)
Albumin: 3.4 g/dL — ABNORMAL LOW (ref 3.5–5.0)
Alkaline Phosphatase: 51 U/L (ref 38–126)
Anion gap: 9 (ref 5–15)
BUN: 12 mg/dL (ref 6–20)
CO2: 21 mmol/L — ABNORMAL LOW (ref 22–32)
Calcium: 9 mg/dL (ref 8.9–10.3)
Chloride: 103 mmol/L (ref 98–111)
Creatinine, Ser: 0.78 mg/dL (ref 0.44–1.00)
GFR calc non Af Amer: >60 mL/min (ref >60)
Glucose, Bld: 190 mg/dL — ABNORMAL HIGH (ref 70–99)
Potassium: 4.5 mmol/L (ref 3.5–5.1)
Sodium: 133 mmol/L — ABNORMAL LOW (ref 135–145)
Total Bilirubin: 0.2 mg/dL — ABNORMAL LOW (ref 0.3–1.2)
Total Protein: 6.7 g/dL (ref 6.5–8.1)

## 2020-08-19 LAB — RESPIRATORY PANEL BY RT PCR (FLU A&B, COVID)
Influenza A by PCR: NEGATIVE
Influenza B by PCR: NEGATIVE
SARS Coronavirus 2 by RT PCR: NEGATIVE

## 2020-08-19 LAB — TYPE AND SCREEN
ABO/RH(D): A POS
Antibody Screen: NEGATIVE

## 2020-08-19 LAB — HEMOGLOBIN AND HEMATOCRIT, BLOOD
HCT: 35.6 % — ABNORMAL LOW (ref 36.0–46.0)
Hemoglobin: 11.1 g/dL — ABNORMAL LOW (ref 12.0–15.0)

## 2020-08-19 LAB — ABO/RH: ABO/RH(D): A POS

## 2020-08-19 LAB — HCG, QUANTITATIVE, PREGNANCY: hCG, Beta Chain, Quant, S: <1 m[IU]/mL (ref <5)

## 2020-08-19 SURGERY — ABDOMINOPLASTY
Anesthesia: General | Site: Abdomen

## 2020-08-19 MED ORDER — SUCCINYLCHOLINE CHLORIDE 200 MG/10ML IV SOSY
PREFILLED_SYRINGE | INTRAVENOUS | Status: DC | PRN
  Administered 2020-08-19: 140 mg via INTRAVENOUS

## 2020-08-19 MED ORDER — LIDOCAINE 2% (20 MG/ML) 5 ML SYRINGE
INTRAMUSCULAR | Status: DC | PRN
  Administered 2020-08-19: 100 mg via INTRAVENOUS

## 2020-08-19 MED ORDER — SODIUM CHLORIDE 0.9 % IV SOLN: INTRAVENOUS | Status: DC | PRN

## 2020-08-19 MED ORDER — PROPOFOL 500 MG/50ML IV EMUL
INTRAVENOUS | Status: DC | PRN
  Administered 2020-08-19: 25 ug/kg/min via INTRAVENOUS

## 2020-08-19 MED ORDER — PHENYLEPHRINE 40 MCG/ML (10ML) SYRINGE FOR IV PUSH (FOR BLOOD PRESSURE SUPPORT)
PREFILLED_SYRINGE | INTRAVENOUS | Status: DC | PRN
  Administered 2020-08-19 (×8): 80 ug via INTRAVENOUS

## 2020-08-19 MED ORDER — DEXAMETHASONE SODIUM PHOSPHATE 10 MG/ML IJ SOLN
INTRAMUSCULAR | Status: DC | PRN
  Administered 2020-08-19: 10 mg via INTRAVENOUS

## 2020-08-19 MED ORDER — DIPHENHYDRAMINE HCL 50 MG/ML IJ SOLN
INTRAMUSCULAR | Status: DC | PRN
  Administered 2020-08-19: 25 mg via INTRAVENOUS

## 2020-08-19 MED ORDER — FENTANYL CITRATE (PF) 250 MCG/5ML IJ SOLN
INTRAMUSCULAR | Status: DC | PRN
Start: 2020-08-19 — End: 2020-08-20
  Administered 2020-08-19 (×3): 50 ug via INTRAVENOUS

## 2020-08-19 MED ORDER — ALBUMIN HUMAN 5 % IV SOLN: INTRAVENOUS | Status: DC | PRN

## 2020-08-19 MED ORDER — LACTATED RINGERS IV SOLN: INTRAVENOUS | Status: DC | PRN

## 2020-08-19 MED ORDER — CEFAZOLIN SODIUM-DEXTROSE 2-3 GM-%(50ML) IV SOLR
INTRAVENOUS | Status: DC | PRN
  Administered 2020-08-19: 2 g via INTRAVENOUS

## 2020-08-19 MED ORDER — PROPOFOL 10 MG/ML IV BOLUS
INTRAVENOUS | Status: DC | PRN
  Administered 2020-08-19: 200 mg via INTRAVENOUS

## 2020-08-19 MED ORDER — ONDANSETRON HCL 4 MG/2ML IJ SOLN
INTRAMUSCULAR | Status: DC | PRN
  Administered 2020-08-19: 4 mg via INTRAVENOUS

## 2020-08-19 MED FILL — OXYCODONE-APAP 5-325MG: 5-325 | 8 days supply | Qty: 30 | Fill #0

## 2020-08-19 MED FILL — PROMETHAZINE 12.5 MG TABLET: 12.5 | 3 days supply | Qty: 10 | Fill #0

## 2020-08-19 MED FILL — CEPHALEXIN 500 MG CAPSULE: 500 | 5 days supply | Qty: 20 | Fill #0

## 2020-08-19 SURGICAL SUPPLY — 36 items
BINDER ABDOMINAL 10 UNV 27-48 (MISCELLANEOUS) ×2 IMPLANT
BINDER ABDOMINAL 12 ML 46-62 (SOFTGOODS) IMPLANT
CLOSURE WOUND 1/2 X4 (GAUZE/BANDAGES/DRESSINGS) ×3
COVER SURGICAL LIGHT HANDLE (MISCELLANEOUS) ×5 IMPLANT
DRAPE LAPAROSCOPIC ABDOMINAL (DRAPES) ×3 IMPLANT
DRAPE WARM FLUID 44X44 (DRAPES) ×2 IMPLANT
DRSG PAD ABDOMINAL 8X10 ST (GAUZE/BANDAGES/DRESSINGS) ×12 IMPLANT
ELECT REM PT RETURN 9FT ADLT (ELECTROSURGICAL) ×3
ELECTRODE REM PT RTRN 9FT ADLT (ELECTROSURGICAL) ×1 IMPLANT
GAUZE SPONGE 4X4 12PLY STRL (GAUZE/BANDAGES/DRESSINGS) ×14 IMPLANT
GLOVE ECLIPSE 7.0 STRL STRAW (GLOVE) ×6 IMPLANT
GOWN STRL REUS W/ TWL LRG LVL3 (GOWN DISPOSABLE) ×3 IMPLANT
GOWN STRL REUS W/TWL LRG LVL3 (GOWN DISPOSABLE) ×9
KIT BASIN OR (CUSTOM PROCEDURE TRAY) ×3 IMPLANT
KIT TURNOVER KIT B (KITS) ×3 IMPLANT
MARKER SKIN DUAL TIP RULER LAB (MISCELLANEOUS) ×3 IMPLANT
NS IRRIG 1000ML POUR BTL (IV SOLUTION) ×10 IMPLANT
PACK GENERAL/GYN (CUSTOM PROCEDURE TRAY) ×3 IMPLANT
PAD ARMBOARD 7.5X6 YLW CONV (MISCELLANEOUS) ×8 IMPLANT
PENCIL SMOKE EVACUATOR (MISCELLANEOUS) ×3 IMPLANT
PIN SAFETY STERILE (MISCELLANEOUS) ×3 IMPLANT
SPONGE LAP 18X18 RF (DISPOSABLE) ×12 IMPLANT
STRIP CLOSURE SKIN 1/2X4 (GAUZE/BANDAGES/DRESSINGS) ×7 IMPLANT
SUT CHROMIC 4 0 PS 2 18 (SUTURE) ×2 IMPLANT
SUT ETHILON 3 0 PS 1 (SUTURE) ×2 IMPLANT
SUT SILK 2 0 (SUTURE) ×3
SUT SILK 2-0 18XBRD TIE 12 (SUTURE) IMPLANT
SUT VIC AB 2-0 CT1 18 (SUTURE) ×2 IMPLANT
SUT VIC AB 3-0 SH 27 (SUTURE) ×3
SUT VIC AB 3-0 SH 27X BRD (SUTURE) IMPLANT
SUT VIC AB 4-0 PS2 27 (SUTURE) ×4 IMPLANT
SYR BULB IRRIG 60ML STRL (SYRINGE) ×2 IMPLANT
TOWEL GREEN STERILE (TOWEL DISPOSABLE) ×6 IMPLANT
TUBE CONNECTING 12'X1/4 (SUCTIONS) ×1
TUBE CONNECTING 12X1/4 (SUCTIONS) ×1 IMPLANT
WATER STERILE IRR 1000ML POUR (IV SOLUTION) ×4 IMPLANT

## 2020-08-19 NOTE — Anesthesia Preprocedure Evaluation (Addendum)
Anesthesia Evaluation  Patient identified by MRN, date of birth, ID band Patient awake    Reviewed: Allergy & Precautions, NPO status , Patient's Chart, lab work & pertinent test results, Unable to perform ROS - Chart review only  Airway Mallampati: II  TM Distance: >3 FB Neck ROM: Full    Dental no notable dental hx. (+) Teeth Intact, Dental Advisory Given   Pulmonary neg pulmonary ROS,    Pulmonary exam normal breath sounds clear to auscultation       Cardiovascular negative cardio ROS Normal cardiovascular exam Rhythm:Regular Rate:Normal     Neuro/Psych  Headaches, PSYCHIATRIC DISORDERS Depression    GI/Hepatic negative GI ROS, Neg liver ROS,   Endo/Other  diabetes  Renal/GU negative Renal ROS     Musculoskeletal negative musculoskeletal ROS (+)   Abdominal   Peds  Hematology negative hematology ROS (+)   Anesthesia Other Findings   Reproductive/Obstetrics                           Anesthesia Physical Anesthesia Plan  ASA: II and emergent  Anesthesia Plan: General   Post-op Pain Management:    Induction: Intravenous, Rapid sequence and Cricoid pressure planned  PONV Risk Score and Plan: 4 or greater and Ondansetron, Dexamethasone, Treatment may vary due to age or medical condition and Midazolam  Airway Management Planned: Oral ETT  Additional Equipment: None  Intra-op Plan:   Post-operative Plan: Extubation in OR  Informed Consent: I have reviewed the patients History and Physical, chart, labs and discussed the procedure including the risks, benefits and alternatives for the proposed anesthesia with the patient or authorized representative who has indicated his/her understanding and acceptance.     Dental advisory given  Plan Discussed with: CRNA  Anesthesia Plan Comments:         Anesthesia Quick Evaluation

## 2020-08-19 NOTE — H&P (Signed)
    HPI:  Olivia Pruitt is an 39 y.o.  female who presents with bleeding .  Pt had an abdominoplasty this afternoon that went uneventful; was discharged home stable.  This evening pt noticed bloody drainage on her garments and more than previously in her JP drain.  I was contacted and gave instructions to empty drain, and monitor output for the next 1 hr.  At this time her drain output was ~75cc bloody fluid and I felt this was more than normal.  Pt was instructed to proceed to ER/hospital for exploration.  Associated signs/symptoms:as above Previous treatment:  Abdominoplasty   Past Medical History:  Diagnosis Date  . Adult ADHD   . Depression   . Diabetes mellitus without complication ([place])   . Headache     Past Surgical History:  Procedure Laterality Date  . NO PAST SURGERIES    Abdominoplasty 08/19/2020; Breast Lift 08/19/2020  Family History  Problem Relation Age of Onset  . Hyperlipidemia Mother   . Hyperlipidemia Father     Social History:  reports that she has never smoked. She has never used smokeless tobacco. She reports that she does not drink alcohol and does not use drugs.  Allergies:  Allergies  Allergen Reactions  . Reglan [Metoclopramide] Other (See Comments)    Acute dystonia  . Codeine Rash    Medications: I have reviewed the patient's current medications.  Results for orders placed or performed during the hospital encounter of 08/19/20 (from the past 48 hour(s))  Type and screen [place]     Status: None (Preliminary result)   Collection Time: 08/19/20 10:23 PM  Result Value Ref Range   ABO/RH(D) PENDING    Antibody Screen PENDING    Sample Expiration      08/22/2020,2359 Performed at [facility], [place] [address] [place], [place] [address]     No results found.  Pertinent items are noted in HPI. Temp:  [98.2 F (36.8 C)] 98.2 F (36.8 C) (10/07 2218) Pulse Rate:  [92] 92 (10/07 2218) Resp:  [16] 16 (10/07 2218) BP:  (106)/(62) 106/62 (10/07 2218) SpO2:  [100 %] 100 % (10/07 2218) General appearance: alert and cooperative Resp: clear to auscultation bilaterally Cardio: regular rate and rhythm GI: s/p abdominoplasty - incision closed, intact, bloddy drainage in JP drain Extremities: extremities normal, atraumatic, no cyanosis or edema   Assessment: S/p Abdominoplasty with bleeding  Plan: Will explore abdomen    Danira Nylander C Erian Rosengren 08/19/2020, 11:03 PM

## 2020-08-19 NOTE — Anesthesia Procedure Notes (Signed)
Procedure Name: Intubation Date/Time: 08/19/2020 11:20 PM Performed by: Jearld Pies, CRNA Pre-anesthesia Checklist: Patient identified, Emergency Drugs available, Suction available and Patient being monitored Patient Re-evaluated:Patient Re-evaluated prior to induction Oxygen Delivery Method: Circle System Utilized Preoxygenation: Pre-oxygenation with 100% oxygen Induction Type: IV induction, Rapid sequence and Cricoid Pressure applied Laryngoscope Size: Glidescope and 3 Grade View: Grade I Tube type: Oral Tube size: 7.0 mm Number of attempts: 1 Airway Equipment and Method: Stylet and Video-laryngoscopy Placement Confirmation: ETT inserted through vocal cords under direct vision,  positive ETCO2 and breath sounds checked- equal and bilateral Secured at: 21 cm Tube secured with: Tape Dental Injury: Teeth and Oropharynx as per pre-operative assessment  Comments: Glidescope utilized d/t unknown COVID status

## 2020-08-20 ENCOUNTER — Encounter (HOSPITAL_COMMUNITY): Payer: Self-pay | Admitting: General Surgery

## 2020-08-20 DIAGNOSIS — Z20822 Contact with and (suspected) exposure to covid-19: Secondary | ICD-10-CM | POA: Diagnosis not present

## 2020-08-20 DIAGNOSIS — L7622 Postprocedural hemorrhage and hematoma of skin and subcutaneous tissue following other procedure: Secondary | ICD-10-CM | POA: Diagnosis not present

## 2020-08-20 DIAGNOSIS — Z888 Allergy status to other drugs, medicaments and biological substances status: Secondary | ICD-10-CM | POA: Diagnosis not present

## 2020-08-20 DIAGNOSIS — E119 Type 2 diabetes mellitus without complications: Secondary | ICD-10-CM | POA: Diagnosis not present

## 2020-08-20 DIAGNOSIS — Z885 Allergy status to narcotic agent status: Secondary | ICD-10-CM | POA: Diagnosis not present

## 2020-08-20 LAB — POCT I-STAT, CHEM 8
BUN: 13 mg/dL (ref 6–20)
Calcium, Ion: 1.27 mmol/L (ref 1.15–1.40)
Chloride: 102 mmol/L (ref 98–111)
Creatinine, Ser: 0.6 mg/dL (ref 0.44–1.00)
Glucose, Bld: 174 mg/dL — ABNORMAL HIGH (ref 70–99)
HCT: 36 % (ref 36.0–46.0)
Hemoglobin: 12.2 g/dL (ref 12.0–15.0)
Potassium: 4.1 mmol/L (ref 3.5–5.1)
Sodium: 135 mmol/L (ref 135–145)
TCO2: 19 mmol/L — ABNORMAL LOW (ref 22–32)

## 2020-08-20 LAB — GLUCOSE, CAPILLARY: Glucose-Capillary: 140 mg/dL — ABNORMAL HIGH (ref 70–99)

## 2020-08-20 MED ORDER — EPHEDRINE SULFATE-NACL 50-0.9 MG/10ML-% IV SOSY
PREFILLED_SYRINGE | INTRAVENOUS | Status: DC | PRN
  Administered 2020-08-20 (×2): 2.5 mg via INTRAVENOUS
  Administered 2020-08-20: 5 mg via INTRAVENOUS
  Administered 2020-08-20: 2.5 mg via INTRAVENOUS

## 2020-08-20 MED ORDER — HYDROMORPHONE HCL 1 MG/ML IJ SOLN: 0.2500 mg | INTRAMUSCULAR | Status: DC | PRN

## 2020-08-20 MED ORDER — 0.9 % SODIUM CHLORIDE (POUR BTL) OPTIME
TOPICAL | Status: DC | PRN
  Administered 2020-08-19 (×4): 1000 mL

## 2020-08-20 MED ORDER — MEPERIDINE HCL 25 MG/ML IJ SOLN: 6.2500 mg | INTRAMUSCULAR | Status: DC | PRN

## 2020-08-20 MED ORDER — PROMETHAZINE HCL 25 MG/ML IJ SOLN: 6.2500 mg | INTRAMUSCULAR | Status: DC | PRN

## 2020-08-20 NOTE — Transfer of Care (Signed)
Immediate Anesthesia Transfer of Care Note  Patient: Olivia Pruitt  Procedure(s) Performed: ABDOMINAL EXPLORATION FOR POST OPERATIVE BLEEDING (N/A Abdomen)  Patient Location: PACU  Anesthesia Type:General  Level of Consciousness: awake, alert  and oriented  Airway & Oxygen Therapy: Patient Spontanous Breathing and Patient connected to nasal cannula oxygen  Post-op Assessment: Report given to RN and Post -op Vital signs reviewed and stable  Post vital signs: Reviewed and stable  Last Vitals:  Vitals Value Taken Time  BP 97/62 08/20/20 0112  Temp    Pulse 99 08/20/20 0114  Resp 21 08/20/20 0114  SpO2 98 % 08/20/20 0114  Vitals shown include unvalidated device data.  Last Pain:  Vitals:   08/19/20 2218  TempSrc: Oral         Complications: No complications documented.

## 2020-08-20 NOTE — Anesthesia Postprocedure Evaluation (Signed)
Anesthesia Post Note  Patient: Olivia Pruitt  Procedure(s) Performed: ABDOMINAL EXPLORATION FOR POST OPERATIVE BLEEDING (N/A Abdomen)     Patient location during evaluation: PACU Anesthesia Type: General Level of consciousness: sedated and patient cooperative Pain management: pain level controlled Vital Signs Assessment: post-procedure vital signs reviewed and stable Respiratory status: spontaneous breathing Cardiovascular status: stable Anesthetic complications: no   No complications documented.  Last Vitals:  Vitals:   08/20/20 0130 08/20/20 0145  BP: 105/65 106/67  Pulse: 89 88  Resp: 17 17  Temp:  (!) 36.2 C  SpO2: 95% 97%    Last Pain:  Vitals:   08/20/20 0145  TempSrc:   PainSc: 0-No pain                 Nolon Nations

## 2020-08-20 NOTE — Progress Notes (Signed)
Patient removed iPhone watch and gave to sister prior to arrive in [place].

## 2020-08-26 ENCOUNTER — Other Ambulatory Visit (HOSPITAL_COMMUNITY): Payer: Self-pay | Admitting: Physician Assistant

## 2020-08-26 MED FILL — MONTELUKAST SOD 10 MG TAB: 10 | 90 days supply | Qty: 90 | Fill #0

## 2020-08-26 NOTE — Op Note (Signed)
NAME: Olivia Pruitt, Olivia Pruitt MEDICAL RECORD HQ:[id number] ACCOUNT [id number] DATE OF BIRTH:12-31-1980 FACILITY: [facility] LOCATION: [facility] PHYSICIAN:Aella Ronda Vivien Presto, MD  OPERATIVE REPORT  DATE OF PROCEDURE:  08/19/2020  PREOPERATIVE DIAGNOSIS:  Status post abdominoplasty with bleeding.  POSTOPERATIVE DIAGNOSIS:  Status post abdominoplasty with bleeding.  PROCEDURE:  Exploration of abdominal wound, control of bleeding.  INDICATIONS:  The patient is a pleasant 39 year old female who had an uneventful abdominoplasty this afternoon.  At some point this evening, she noticed that her undergarments were soaked in serosanguineous and bloody fluid.  I advised her to proceed to  the emergency department for possible abdominal wound exploration.  On evaluation, she had significant drainage in her Jackson-Pratt drain that was mostly sanguineous.  The patient remained stable throughout this process.  She was consented and taken to  the operating room urgently for exploration for bleeding.  DESCRIPTION OF PROCEDURE:  The patient was taken to the operating room and placed supine on the operating table.  Timeout was performed.  Anesthesia was administered without difficulty.  The abdomen was prepped and draped in the normal sterile fashion.   Her On-Q pain pump catheter as well as her current JP drain were prepped in the operative field.  The Steri-Strips were removed from her incision site.  The wound was opened for the length of the incision across her lower abdomen.  There was some  hematoma along the abdominal wall.  This was evacuated and then thorough irrigation and inspection of the abdominal wall fascia, abdominal wall flap and areas were evaluated.  After thorough evaluation, there did not appear to be a significant single  source for the bleeding.  The bleeding seemed to be coming from around her drain site and therefore this was explored as well.  In addition, upon placing pressure over her mons area,  there was some oozing from where some liposuction had been performed  previously.  A second look was then performed after irrigation of the abdominal wall and again no gross bleeding was encountered.  Next, the incision was closed in layers.  The deep fascial layer with 2-0 Vicryl, followed by subdermal layer of 3-0  Vicryl, followed by subcuticular closure of 4-0 Vicryl.  Steri-Strips were placed.  Sterile dressing was also placed and the patient was placed in an abdominal binder.  Estimated blood loss is 150 mL.  No acute complications.  The patient will be  monitored closely in PACU and if stable, sent home with close followup.  HN/NUANCE  D:08/25/2020 T:08/26/2020 JOB:[id number]

## 2020-09-22 DIAGNOSIS — N393 Stress incontinence (female) (male): Secondary | ICD-10-CM | POA: Diagnosis not present

## 2020-09-22 DIAGNOSIS — N8111 Cystocele, midline: Secondary | ICD-10-CM | POA: Diagnosis not present

## 2020-09-29 MED FILL — ETONOGESTREL-ETHINYL ESTRAD: 0.12-0.015 | 84 days supply | Qty: 3 | Fill #3

## 2020-11-02 ENCOUNTER — Other Ambulatory Visit (HOSPITAL_COMMUNITY): Payer: Self-pay | Admitting: Physician Assistant

## 2020-11-02 DIAGNOSIS — F902 Attention-deficit hyperactivity disorder, combined type: Secondary | ICD-10-CM | POA: Diagnosis not present

## 2020-11-02 DIAGNOSIS — F338 Other recurrent depressive disorders: Secondary | ICD-10-CM | POA: Diagnosis not present

## 2020-11-02 DIAGNOSIS — Z79899 Other long term (current) drug therapy: Secondary | ICD-10-CM | POA: Diagnosis not present

## 2020-11-02 DIAGNOSIS — F411 Generalized anxiety disorder: Secondary | ICD-10-CM | POA: Diagnosis not present

## 2020-11-02 MED FILL — DULOXETINE HCL 60 MG CPEP: 60 | 90 days supply | Qty: 90 | Fill #0

## 2020-12-31 MED FILL — ETONOGESTREL-ETHINYL ESTRAD: 0.12-0.015 | 84 days supply | Qty: 3 | Fill #4

## 2020-12-31 MED FILL — MONTELUKAST SOD 10 MG TAB: 10 | 90 days supply | Qty: 90 | Fill #1

## 2021-02-09 MED FILL — DULOXETINE HCL 60 MG CPEP: 60 | 90 days supply | Qty: 90 | Fill #1

## 2021-02-15 DIAGNOSIS — Z0000 Encounter for general adult medical examination without abnormal findings: Secondary | ICD-10-CM | POA: Diagnosis not present

## 2021-03-11 ENCOUNTER — Other Ambulatory Visit (HOSPITAL_COMMUNITY): Payer: Self-pay

## 2021-03-14 ENCOUNTER — Other Ambulatory Visit (HOSPITAL_COMMUNITY): Payer: Self-pay

## 2021-03-14 MED ORDER — ETONOGESTREL-ETHINYL ESTRADIOL 0.12-0.015 MG/24HR VA RING
VAGINAL_RING | VAGINAL | 0 refills | Status: DC
  Filled 2021-03-14: qty 3, 84d supply, fill #0

## 2021-04-05 ENCOUNTER — Other Ambulatory Visit (HOSPITAL_COMMUNITY): Payer: Self-pay

## 2021-04-05 DIAGNOSIS — L209 Atopic dermatitis, unspecified: Secondary | ICD-10-CM | POA: Diagnosis not present

## 2021-04-05 MED ORDER — PREDNISONE 20 MG PO TABS
ORAL_TABLET | ORAL | 0 refills | Status: AC
  Filled 2021-04-05: qty 7, 6d supply, fill #0

## 2021-04-05 MED ORDER — DIPHENHYDRAMINE HCL 25 MG PO CAPS: 25.0000 mg | ORAL_CAPSULE | Freq: Three times a day (TID) | ORAL | 0 refills | Status: AC | PRN

## 2021-04-19 ENCOUNTER — Other Ambulatory Visit (HOSPITAL_COMMUNITY): Payer: Self-pay

## 2021-04-19 DIAGNOSIS — Z01419 Encounter for gynecological examination (general) (routine) without abnormal findings: Secondary | ICD-10-CM | POA: Diagnosis not present

## 2021-04-19 DIAGNOSIS — L659 Nonscarring hair loss, unspecified: Secondary | ICD-10-CM | POA: Diagnosis not present

## 2021-04-19 DIAGNOSIS — Z6826 Body mass index (BMI) 26.0-26.9, adult: Secondary | ICD-10-CM | POA: Diagnosis not present

## 2021-04-19 DIAGNOSIS — R309 Painful micturition, unspecified: Secondary | ICD-10-CM | POA: Diagnosis not present

## 2021-04-19 MED ORDER — ETONOGESTREL-ETHINYL ESTRADIOL 0.12-0.015 MG/24HR VA RING
VAGINAL_RING | VAGINAL | 4 refills | Status: DC
  Filled 2021-04-19: qty 3, 90d supply, fill #0
  Filled 2021-05-26 – 2021-05-31 (×2): qty 3, 84d supply, fill #0
  Filled 2021-08-30: qty 3, 84d supply, fill #1
  Filled 2021-11-15: qty 3, 84d supply, fill #2
  Filled 2022-01-10 – 2022-01-31 (×2): qty 3, 84d supply, fill #3

## 2021-04-22 ENCOUNTER — Other Ambulatory Visit (HOSPITAL_COMMUNITY): Payer: Self-pay

## 2021-04-22 MED ORDER — AMOXICILLIN 500 MG PO CAPS
500.0000 mg | ORAL_CAPSULE | Freq: Two times a day (BID) | ORAL | 0 refills | Status: AC
  Filled 2021-04-22: qty 14, 7d supply, fill #0

## 2021-05-03 ENCOUNTER — Other Ambulatory Visit (HOSPITAL_COMMUNITY): Payer: Self-pay

## 2021-05-03 MED ORDER — SODIUM FLUORIDE 0.2 % MT SOLN
OROMUCOSAL | 5 refills | Status: AC
  Filled 2021-05-03: qty 473, 90d supply, fill #0

## 2021-05-04 ENCOUNTER — Other Ambulatory Visit (HOSPITAL_COMMUNITY): Payer: Self-pay

## 2021-05-04 DIAGNOSIS — L509 Urticaria, unspecified: Secondary | ICD-10-CM | POA: Diagnosis not present

## 2021-05-04 MED ORDER — METHYLPREDNISOLONE 4 MG PO TBPK
ORAL_TABLET | ORAL | 0 refills | Status: AC
  Filled 2021-05-04: qty 21, 6d supply, fill #0

## 2021-05-10 ENCOUNTER — Other Ambulatory Visit (HOSPITAL_COMMUNITY): Payer: Self-pay

## 2021-05-10 MED FILL — Duloxetine HCl Enteric Coated Pellets Cap 60 MG (Base Eq): ORAL | 90 days supply | Qty: 90 | Fill #0 | Status: AC

## 2021-05-11 ENCOUNTER — Other Ambulatory Visit (HOSPITAL_COMMUNITY): Payer: Self-pay

## 2021-05-26 ENCOUNTER — Other Ambulatory Visit (HOSPITAL_COMMUNITY): Payer: Self-pay

## 2021-05-31 ENCOUNTER — Other Ambulatory Visit (HOSPITAL_COMMUNITY): Payer: Self-pay

## 2021-05-31 MED ORDER — MONTELUKAST SODIUM 10 MG PO TABS
10.0000 mg | ORAL_TABLET | Freq: Every day | ORAL | 1 refills | Status: DC
  Filled 2021-05-31: qty 90, 90d supply, fill #0
  Filled 2021-08-30: qty 90, 90d supply, fill #1

## 2021-06-07 DIAGNOSIS — L249 Irritant contact dermatitis, unspecified cause: Secondary | ICD-10-CM | POA: Diagnosis not present

## 2021-06-20 ENCOUNTER — Other Ambulatory Visit (HOSPITAL_COMMUNITY): Payer: Self-pay

## 2021-06-20 DIAGNOSIS — J301 Allergic rhinitis due to pollen: Secondary | ICD-10-CM | POA: Diagnosis not present

## 2021-06-20 DIAGNOSIS — L501 Idiopathic urticaria: Secondary | ICD-10-CM | POA: Diagnosis not present

## 2021-06-20 DIAGNOSIS — J3089 Other allergic rhinitis: Secondary | ICD-10-CM | POA: Diagnosis not present

## 2021-06-20 DIAGNOSIS — J309 Allergic rhinitis, unspecified: Secondary | ICD-10-CM | POA: Diagnosis not present

## 2021-06-20 MED ORDER — CARESTART COVID-19 HOME TEST VI KIT
PACK | 0 refills | Status: AC
  Filled 2021-06-20: qty 4, 4d supply, fill #0

## 2021-06-20 MED ORDER — TACROLIMUS 0.1 % EX OINT
TOPICAL_OINTMENT | CUTANEOUS | 1 refills | Status: AC
  Filled 2021-06-20 (×2): qty 60, 30d supply, fill #0

## 2021-06-21 ENCOUNTER — Other Ambulatory Visit (HOSPITAL_COMMUNITY): Payer: Self-pay

## 2021-08-11 ENCOUNTER — Other Ambulatory Visit (HOSPITAL_COMMUNITY): Payer: Self-pay

## 2021-08-11 MED FILL — Duloxetine HCl Enteric Coated Pellets Cap 60 MG (Base Eq): ORAL | 90 days supply | Qty: 90 | Fill #1 | Status: AC

## 2021-08-16 ENCOUNTER — Other Ambulatory Visit (HOSPITAL_COMMUNITY): Payer: Self-pay

## 2021-08-16 DIAGNOSIS — F419 Anxiety disorder, unspecified: Secondary | ICD-10-CM | POA: Diagnosis not present

## 2021-08-16 DIAGNOSIS — L03221 Cellulitis of neck: Secondary | ICD-10-CM | POA: Diagnosis not present

## 2021-08-16 DIAGNOSIS — F32A Depression, unspecified: Secondary | ICD-10-CM | POA: Diagnosis not present

## 2021-08-16 MED ORDER — DOXYCYCLINE MONOHYDRATE 100 MG PO TABS
100.0000 mg | ORAL_TABLET | Freq: Two times a day (BID) | ORAL | 0 refills | Status: AC
  Filled 2021-08-16: qty 14, 7d supply, fill #0

## 2021-08-16 MED ORDER — DULOXETINE HCL 30 MG PO CPEP
30.0000 mg | ORAL_CAPSULE | Freq: Every day | ORAL | 2 refills | Status: DC
  Filled 2021-08-16: qty 30, 30d supply, fill #0

## 2021-08-22 DIAGNOSIS — L03221 Cellulitis of neck: Secondary | ICD-10-CM | POA: Diagnosis not present

## 2021-08-30 ENCOUNTER — Other Ambulatory Visit (HOSPITAL_COMMUNITY): Payer: Self-pay

## 2021-11-15 ENCOUNTER — Other Ambulatory Visit (HOSPITAL_COMMUNITY): Payer: Self-pay

## 2022-01-10 ENCOUNTER — Other Ambulatory Visit (HOSPITAL_COMMUNITY): Payer: Self-pay

## 2022-01-10 MED ORDER — MONTELUKAST SODIUM 10 MG PO TABS
10.0000 mg | ORAL_TABLET | Freq: Every day | ORAL | 1 refills | Status: DC
  Filled 2022-01-10: qty 90, 90d supply, fill #0
  Filled 2022-04-11: qty 90, 90d supply, fill #1

## 2022-01-31 ENCOUNTER — Other Ambulatory Visit (HOSPITAL_COMMUNITY): Payer: Self-pay

## 2022-02-14 ENCOUNTER — Other Ambulatory Visit (HOSPITAL_COMMUNITY): Payer: Self-pay

## 2022-02-14 DIAGNOSIS — M21611 Bunion of right foot: Secondary | ICD-10-CM | POA: Diagnosis not present

## 2022-02-14 DIAGNOSIS — M79674 Pain in right toe(s): Secondary | ICD-10-CM | POA: Diagnosis not present

## 2022-02-14 DIAGNOSIS — M21612 Bunion of left foot: Secondary | ICD-10-CM | POA: Diagnosis not present

## 2022-02-14 MED ORDER — DICLOFENAC SODIUM 75 MG PO TBEC
75.0000 mg | DELAYED_RELEASE_TABLET | Freq: Two times a day (BID) | ORAL | 1 refills | Status: AC
  Filled 2022-02-14: qty 60, 30d supply, fill #0

## 2022-02-16 DIAGNOSIS — J309 Allergic rhinitis, unspecified: Secondary | ICD-10-CM | POA: Insufficient documentation

## 2022-02-16 DIAGNOSIS — Z0000 Encounter for general adult medical examination without abnormal findings: Secondary | ICD-10-CM | POA: Diagnosis not present

## 2022-02-16 DIAGNOSIS — Z8261 Family history of arthritis: Secondary | ICD-10-CM | POA: Diagnosis not present

## 2022-02-16 DIAGNOSIS — J301 Allergic rhinitis due to pollen: Secondary | ICD-10-CM | POA: Insufficient documentation

## 2022-02-16 DIAGNOSIS — L501 Idiopathic urticaria: Secondary | ICD-10-CM | POA: Insufficient documentation

## 2022-02-16 DIAGNOSIS — E559 Vitamin D deficiency, unspecified: Secondary | ICD-10-CM | POA: Diagnosis not present

## 2022-03-06 ENCOUNTER — Other Ambulatory Visit (HOSPITAL_COMMUNITY): Payer: Self-pay

## 2022-03-06 DIAGNOSIS — E6609 Other obesity due to excess calories: Secondary | ICD-10-CM | POA: Diagnosis not present

## 2022-03-06 DIAGNOSIS — R7303 Prediabetes: Secondary | ICD-10-CM | POA: Diagnosis not present

## 2022-03-06 MED ORDER — WEGOVY 0.25 MG/0.5ML ~~LOC~~ SOAJ
0.2500 mg | SUBCUTANEOUS | 1 refills | Status: AC
  Filled 2022-03-06: qty 6, 84d supply, fill #0
  Filled 2022-03-08: qty 2, 28d supply, fill #0
  Filled 2022-03-09: qty 6, 84d supply, fill #0
  Filled 2022-03-10 – 2022-05-10 (×3): qty 2, 28d supply, fill #0

## 2022-03-08 ENCOUNTER — Other Ambulatory Visit (HOSPITAL_COMMUNITY): Payer: Self-pay

## 2022-03-08 DIAGNOSIS — Z1231 Encounter for screening mammogram for malignant neoplasm of breast: Secondary | ICD-10-CM | POA: Diagnosis not present

## 2022-03-09 ENCOUNTER — Other Ambulatory Visit (HOSPITAL_COMMUNITY): Payer: Self-pay

## 2022-03-10 ENCOUNTER — Other Ambulatory Visit (HOSPITAL_COMMUNITY): Payer: Self-pay

## 2022-03-27 DIAGNOSIS — R928 Other abnormal and inconclusive findings on diagnostic imaging of breast: Secondary | ICD-10-CM | POA: Diagnosis not present

## 2022-03-27 DIAGNOSIS — R922 Inconclusive mammogram: Secondary | ICD-10-CM | POA: Diagnosis not present

## 2022-04-11 ENCOUNTER — Other Ambulatory Visit (HOSPITAL_COMMUNITY): Payer: Self-pay

## 2022-05-02 DIAGNOSIS — Z6828 Body mass index (BMI) 28.0-28.9, adult: Secondary | ICD-10-CM | POA: Diagnosis not present

## 2022-05-02 DIAGNOSIS — Z1151 Encounter for screening for human papillomavirus (HPV): Secondary | ICD-10-CM | POA: Diagnosis not present

## 2022-05-02 DIAGNOSIS — Z01419 Encounter for gynecological examination (general) (routine) without abnormal findings: Secondary | ICD-10-CM | POA: Diagnosis not present

## 2022-05-02 DIAGNOSIS — Z124 Encounter for screening for malignant neoplasm of cervix: Secondary | ICD-10-CM | POA: Diagnosis not present

## 2022-05-10 ENCOUNTER — Other Ambulatory Visit (HOSPITAL_COMMUNITY): Payer: Self-pay

## 2022-05-10 ENCOUNTER — Other Ambulatory Visit (HOSPITAL_BASED_OUTPATIENT_CLINIC_OR_DEPARTMENT_OTHER): Payer: Self-pay

## 2022-05-11 DIAGNOSIS — R7989 Other specified abnormal findings of blood chemistry: Secondary | ICD-10-CM | POA: Diagnosis not present

## 2022-05-11 DIAGNOSIS — E6609 Other obesity due to excess calories: Secondary | ICD-10-CM | POA: Diagnosis not present

## 2022-05-11 DIAGNOSIS — R7303 Prediabetes: Secondary | ICD-10-CM | POA: Diagnosis not present

## 2022-05-24 ENCOUNTER — Other Ambulatory Visit (HOSPITAL_COMMUNITY): Payer: Self-pay

## 2022-05-25 ENCOUNTER — Other Ambulatory Visit (HOSPITAL_COMMUNITY): Payer: Self-pay

## 2022-05-25 ENCOUNTER — Other Ambulatory Visit (HOSPITAL_BASED_OUTPATIENT_CLINIC_OR_DEPARTMENT_OTHER): Payer: Self-pay

## 2022-05-25 MED ORDER — WEGOVY 0.5 MG/0.5ML ~~LOC~~ SOAJ
SUBCUTANEOUS | 0 refills | Status: AC
  Filled 2022-05-25: qty 2, 28d supply, fill #0

## 2022-05-25 MED ORDER — ETONOGESTREL-ETHINYL ESTRADIOL 0.12-0.015 MG/24HR VA RING
VAGINAL_RING | VAGINAL | 4 refills | Status: DC
  Filled 2022-05-25: qty 3, 84d supply, fill #0
  Filled 2022-07-04: qty 3, 84d supply, fill #1

## 2022-05-26 ENCOUNTER — Other Ambulatory Visit (HOSPITAL_BASED_OUTPATIENT_CLINIC_OR_DEPARTMENT_OTHER): Payer: Self-pay

## 2022-05-29 DIAGNOSIS — F330 Major depressive disorder, recurrent, mild: Secondary | ICD-10-CM | POA: Diagnosis not present

## 2022-05-29 DIAGNOSIS — Z79899 Other long term (current) drug therapy: Secondary | ICD-10-CM | POA: Diagnosis not present

## 2022-05-29 DIAGNOSIS — E6609 Other obesity due to excess calories: Secondary | ICD-10-CM | POA: Diagnosis not present

## 2022-05-30 ENCOUNTER — Other Ambulatory Visit (HOSPITAL_COMMUNITY): Payer: Self-pay

## 2022-05-30 ENCOUNTER — Other Ambulatory Visit (HOSPITAL_BASED_OUTPATIENT_CLINIC_OR_DEPARTMENT_OTHER): Payer: Self-pay

## 2022-05-30 MED ORDER — FLUOXETINE HCL 10 MG PO CAPS
ORAL_CAPSULE | ORAL | 1 refills | Status: DC
  Filled 2022-05-30 (×2): qty 60, 30d supply, fill #0

## 2022-05-31 ENCOUNTER — Other Ambulatory Visit (HOSPITAL_COMMUNITY): Payer: Self-pay

## 2022-05-31 ENCOUNTER — Other Ambulatory Visit (HOSPITAL_BASED_OUTPATIENT_CLINIC_OR_DEPARTMENT_OTHER): Payer: Self-pay

## 2022-05-31 DIAGNOSIS — X58XXXA Exposure to other specified factors, initial encounter: Secondary | ICD-10-CM | POA: Diagnosis not present

## 2022-05-31 DIAGNOSIS — S39012A Strain of muscle, fascia and tendon of lower back, initial encounter: Secondary | ICD-10-CM | POA: Diagnosis not present

## 2022-05-31 MED ORDER — CYCLOBENZAPRINE HCL 5 MG PO TABS
5.0000 mg | ORAL_TABLET | Freq: Three times a day (TID) | ORAL | 0 refills | Status: DC | PRN
  Filled 2022-05-31: qty 30, 5d supply, fill #0

## 2022-05-31 MED ORDER — MELOXICAM 15 MG PO TABS
15.0000 mg | ORAL_TABLET | Freq: Every day | ORAL | 0 refills | Status: DC
  Filled 2022-05-31: qty 14, 14d supply, fill #0

## 2022-06-12 ENCOUNTER — Other Ambulatory Visit (HOSPITAL_COMMUNITY): Payer: Self-pay

## 2022-06-12 DIAGNOSIS — F419 Anxiety disorder, unspecified: Secondary | ICD-10-CM | POA: Diagnosis not present

## 2022-06-12 DIAGNOSIS — R7303 Prediabetes: Secondary | ICD-10-CM | POA: Diagnosis not present

## 2022-06-12 DIAGNOSIS — R7989 Other specified abnormal findings of blood chemistry: Secondary | ICD-10-CM | POA: Diagnosis not present

## 2022-06-12 DIAGNOSIS — E6609 Other obesity due to excess calories: Secondary | ICD-10-CM | POA: Diagnosis not present

## 2022-06-12 DIAGNOSIS — Z79899 Other long term (current) drug therapy: Secondary | ICD-10-CM | POA: Diagnosis not present

## 2022-06-12 DIAGNOSIS — Z1329 Encounter for screening for other suspected endocrine disorder: Secondary | ICD-10-CM | POA: Diagnosis not present

## 2022-06-12 MED ORDER — FLUOXETINE HCL 20 MG PO CAPS
20.0000 mg | ORAL_CAPSULE | Freq: Every day | ORAL | 0 refills | Status: DC
  Filled 2022-06-12 – 2022-06-28 (×2): qty 90, 90d supply, fill #0

## 2022-06-21 ENCOUNTER — Other Ambulatory Visit (HOSPITAL_COMMUNITY): Payer: Self-pay

## 2022-06-23 ENCOUNTER — Other Ambulatory Visit (HOSPITAL_BASED_OUTPATIENT_CLINIC_OR_DEPARTMENT_OTHER): Payer: Self-pay

## 2022-06-23 MED ORDER — WEGOVY 1 MG/0.5ML ~~LOC~~ SOAJ
SUBCUTANEOUS | 0 refills | Status: DC
Start: 2022-06-23 — End: 2022-07-21
  Filled 2022-06-23 – 2022-06-28 (×2): qty 2, 28d supply, fill #0

## 2022-06-28 ENCOUNTER — Other Ambulatory Visit (HOSPITAL_COMMUNITY): Payer: Self-pay

## 2022-06-28 ENCOUNTER — Other Ambulatory Visit (HOSPITAL_BASED_OUTPATIENT_CLINIC_OR_DEPARTMENT_OTHER): Payer: Self-pay

## 2022-07-04 ENCOUNTER — Other Ambulatory Visit (HOSPITAL_COMMUNITY): Payer: Self-pay

## 2022-07-04 ENCOUNTER — Other Ambulatory Visit: Payer: Self-pay | Admitting: Radiology

## 2022-07-04 MED ORDER — MONTELUKAST SODIUM 10 MG PO TABS
10.0000 mg | ORAL_TABLET | Freq: Every day | ORAL | 1 refills | Status: AC
  Filled 2022-07-04: qty 90, 90d supply, fill #0

## 2022-07-07 ENCOUNTER — Other Ambulatory Visit (HOSPITAL_COMMUNITY): Payer: Self-pay

## 2022-07-07 MED ORDER — ETONOGESTREL-ETHINYL ESTRADIOL 0.12-0.015 MG/24HR VA RING
VAGINAL_RING | VAGINAL | 4 refills | Status: AC
  Filled 2022-07-07 – 2022-07-13 (×2): qty 3, 84d supply, fill #0
  Filled 2022-07-13: qty 1, 21d supply, fill #0
  Filled 2022-08-02: qty 1, 21d supply, fill #1
  Filled 2022-08-21: qty 1, 21d supply, fill #2
  Filled 2022-09-12: qty 1, 21d supply, fill #3
  Filled 2022-09-30: qty 1, 21d supply, fill #4

## 2022-07-13 ENCOUNTER — Other Ambulatory Visit (HOSPITAL_COMMUNITY): Payer: Self-pay

## 2022-07-13 ENCOUNTER — Other Ambulatory Visit: Payer: Self-pay | Admitting: Radiology

## 2022-07-14 ENCOUNTER — Other Ambulatory Visit (HOSPITAL_COMMUNITY): Payer: Self-pay

## 2022-07-18 ENCOUNTER — Other Ambulatory Visit (HOSPITAL_COMMUNITY): Payer: Self-pay

## 2022-07-20 ENCOUNTER — Other Ambulatory Visit (HOSPITAL_COMMUNITY): Payer: Self-pay

## 2022-07-21 ENCOUNTER — Other Ambulatory Visit (HOSPITAL_BASED_OUTPATIENT_CLINIC_OR_DEPARTMENT_OTHER): Payer: Self-pay

## 2022-07-21 MED ORDER — WEGOVY 1 MG/0.5ML ~~LOC~~ SOAJ
SUBCUTANEOUS | 0 refills | Status: AC
  Filled 2022-07-21: qty 2, 28d supply, fill #0

## 2022-08-02 ENCOUNTER — Other Ambulatory Visit (HOSPITAL_COMMUNITY): Payer: Self-pay

## 2022-08-04 ENCOUNTER — Other Ambulatory Visit (HOSPITAL_COMMUNITY): Payer: Self-pay

## 2022-08-21 ENCOUNTER — Other Ambulatory Visit (HOSPITAL_BASED_OUTPATIENT_CLINIC_OR_DEPARTMENT_OTHER): Payer: Self-pay

## 2022-08-21 ENCOUNTER — Other Ambulatory Visit (HOSPITAL_COMMUNITY): Payer: Self-pay

## 2022-08-21 MED ORDER — WEGOVY 1.7 MG/0.75ML ~~LOC~~ SOAJ
1.7000 mg | SUBCUTANEOUS | 0 refills | Status: DC
  Filled 2022-08-21: qty 3, 28d supply, fill #0

## 2022-09-12 ENCOUNTER — Other Ambulatory Visit (HOSPITAL_COMMUNITY): Payer: Self-pay

## 2022-09-12 DIAGNOSIS — Z131 Encounter for screening for diabetes mellitus: Secondary | ICD-10-CM | POA: Diagnosis not present

## 2022-09-12 DIAGNOSIS — Z79899 Other long term (current) drug therapy: Secondary | ICD-10-CM | POA: Diagnosis not present

## 2022-09-12 DIAGNOSIS — Z13220 Encounter for screening for lipoid disorders: Secondary | ICD-10-CM | POA: Diagnosis not present

## 2022-09-12 DIAGNOSIS — F419 Anxiety disorder, unspecified: Secondary | ICD-10-CM | POA: Diagnosis not present

## 2022-09-12 DIAGNOSIS — Z1329 Encounter for screening for other suspected endocrine disorder: Secondary | ICD-10-CM | POA: Diagnosis not present

## 2022-09-12 MED ORDER — FLUOXETINE HCL 20 MG PO CAPS
20.0000 mg | ORAL_CAPSULE | Freq: Every day | ORAL | 0 refills | Status: DC
  Filled 2022-09-12 (×2): qty 90, 90d supply, fill #0

## 2022-09-14 ENCOUNTER — Encounter: Payer: Self-pay | Admitting: Podiatry

## 2022-09-14 ENCOUNTER — Telehealth: Payer: Self-pay

## 2022-09-14 ENCOUNTER — Ambulatory Visit (INDEPENDENT_AMBULATORY_CARE_PROVIDER_SITE_OTHER): Payer: 59 | Admitting: Podiatry

## 2022-09-14 ENCOUNTER — Ambulatory Visit (INDEPENDENT_AMBULATORY_CARE_PROVIDER_SITE_OTHER): Payer: 59

## 2022-09-14 DIAGNOSIS — M2011 Hallux valgus (acquired), right foot: Secondary | ICD-10-CM

## 2022-09-14 NOTE — Telephone Encounter (Signed)
Olivia Pruitt is scheduling surgery with Dr. Milinda Pointer. She's currently taking Wegovy. She has been advise to stop Wegovy 7 days prior to surgery. She stated she understood this.

## 2022-09-14 NOTE — Progress Notes (Signed)
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 female presents with the above complaint.   ROS: Denies fever chills nausea vomit muscle aches pains calf pain back pain chest pain shortness of breath.  She relates that she has pain on ambulation of the first metatarsal phalangeal joint of the right foot and has increased in severity over the past several months.  States that she can hardly walk a mile now as she really enjoys walking to help maintain her good health and activities.  She has tried different shoe gear.  Past Medical History:  Diagnosis Date   Adult ADHD    Depression    Diabetes mellitus without complication ([place])    Headache    Past Surgical History:  Procedure Laterality Date   ABDOMINOPLASTY N/A 08/19/2020   Procedure: ABDOMINAL EXPLORATION FOR POST OPERATIVE BLEEDING;  Surgeon: Dayna Barker, MD;  Location: [place];  Service: Plastics;  Laterality: N/A;   NO PAST SURGERIES      Current Outpatient Medications:    COVID-19 At Home Antigen Test (CARESTART COVID-19 HOME TEST) KIT, USE AS DIRECTED WITHIN PACKAGE INSTRUCTIONS., Disp: 4 each, Rfl: 0   diclofenac (VOLTAREN) 75 MG EC tablet, Take 1 tablet by mouth twice a day with meals, Disp: 60 tablet, Rfl: 1   diphenhydrAMINE (BENADRYL) 25 mg capsule, Take 1 capsule (25 mg total) by mouth 3 (three) times daily as needed., Disp: 15 capsule, Rfl: 0   doxycycline (ADOXA) 100 MG tablet, Take 1 tablet (100 mg total) by mouth 2 (two) times daily., Disp: 14 tablet, Rfl: 0   DULoxetine (CYMBALTA) 60 MG capsule, TAKE 1 CAPSULE BY MOUTH ONCE A DAY, Disp: 90 capsule, Rfl: 3   etonogestrel-ethinyl estradiol (NUVARING) 0.12-0.015 MG/24HR  vaginal ring, Place 1 each vaginally every 28 (twenty-eight) days. Insert vaginally and leave in place for 3 consecutive weeks, then remove for 1 week., Disp: , Rfl:    etonogestrel-ethinyl estradiol (NUVARING) 0.12-0.015 MG/24HR vaginal ring, INSERT 1 RING VAGINALLY EVERY MONTH., Disp: 3 each, Rfl: 4   etonogestrel-ethinyl estradiol (NUVARING) 0.12-0.015 MG/24HR vaginal ring, Insert 1 ring every month by vaginal route as directed., Disp: 3 each, Rfl: 4   FLUoxetine (PROZAC) 20 MG capsule, Take 1 capsule (20 mg total) by mouth daily., Disp: 90 capsule, Rfl: 0   L-Methylfolate-Algae (DEPLIN 15 PO), Take 1 tablet by mouth every morning. , Disp: , Rfl:    loratadine (CLARITIN) 10 MG tablet, Take 10 mg by mouth daily., Disp: , Rfl:    methylPREDNISolone (MEDROL DOSEPAK) 4 MG TBPK tablet, Follow package instructions, Disp: 21 tablet, Rfl: 0   montelukast (SINGULAIR) 10 MG tablet, Take 10 mg by mouth at bedtime., Disp: , Rfl:    montelukast (SINGULAIR) 10 MG tablet, TAKE 1 TABLET BY MOUTH AT BEDTIME, Disp: 90 tablet, Rfl: 1   promethazine (PHENERGAN) 12.5 MG tablet, TAKE 1 TABLET BY MOUTH EVERY 6 HOURS AS NEEDED FOR NAUSEA, Disp: 10 tablet, Rfl: 0   Semaglutide-Weight Management (WEGOVY) 0.25 MG/0.5ML SOAJ, Inject 0.25 mg into the skin every 7 (seven) days., Disp: 6 mL, Rfl: 1   SODIUM FLUORIDE, DENTAL RINSE, (PREVIDENT) 0.2 % SOLN, USE  AS AN ORAL RINSE, AS DIRECTED ON PACKAGE, Disp: 473 mL, Rfl: 5   tacrolimus (PROTOPIC) 0.1 % ointment, Apply externally 2 times daily, Disp: 60 g, Rfl: 1   WEGOVY 0.5 MG/0.5ML SOAJ, Inject 0.5 mg subcutaneously every week for weight loss, Disp: 2 mL, Rfl: 0   WEGOVY 1 MG/0.5ML SOAJ, Inject 1 mg subcutaneously every week for weight loss, Disp: 2 mL, Rfl: 0   WEGOVY 1.7 MG/0.75ML SOAJ, Take 1.7 mg by mouth once a week for weight loss., Disp: 3 mL, Rfl: 0  Allergies  Allergen Reactions   Promethazine Other (See Comments)   Reglan [Metoclopramide] Other (See Comments)     Acute dystonia   Codeine Rash   Review of Systems Objective:  There were no vitals filed for this visit.  General: Well developed, nourished, in no acute distress, alert and oriented x3   Dermatological: Skin is warm, dry and supple bilateral. Nails x 10 are well maintained; remaining integument appears unremarkable at this time. There are no open sores, no preulcerative lesions, no rash or signs of infection present.  Vascular: Dorsalis Pedis artery and Posterior Tibial artery pedal pulses are 2/4 bilateral with immedate capillary fill time. Pedal hair growth present. No varicosities and no lower extremity edema present bilateral.   Neruologic: Grossly intact via light touch bilateral. Vibratory intact via tuning fork bilateral. Protective threshold with Semmes Wienstein monofilament intact to all pedal sites bilateral. Patellar and Achilles deep tendon reflexes 2+ bilateral. No Babinski or clonus noted bilateral.   Musculoskeletal: No gross boney pedal deformities bilateral. No pain, crepitus, or limitation noted with foot and ankle range of motion bilateral. Muscular strength 5/5 in all groups tested bilateral.  Pain on palpation of the head of the first metatarsal on dorsal medial aspect right foot.  She has good range of motion though on end range of motion of dorsiflexion is considerable amount of discomfort and there is palpable fluctuance within the joint itself.  Gait: Unassisted, Nonantalgic.    Radiographs:  Radiographs taken today demonstrate an increase in the first intermetatarsal angle greater than normal value right foot.  Hallux abductus angle greater than normal value she does have a tibial sesamoid position of 4 and also has cystic deformity and formation over the hypertrophic medial condyle of the head of the first metatarsal.  No other osseous abnormalities are identified.  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  Plan: Discussed etiology pathology conservative versus surgical therapies at this point consented her for an [facility] bunion repair with screw fixation.  She understands this and is amenable to it we did discuss the possible postop complications which may include but are not limited to postop pain bleeding swell infection recurrence need for further surgery overcorrection under correction also digit loss limb loss of life.     Teondra Newburg T. [place], [place]

## 2022-09-17 ENCOUNTER — Other Ambulatory Visit (HOSPITAL_BASED_OUTPATIENT_CLINIC_OR_DEPARTMENT_OTHER): Payer: Self-pay

## 2022-09-17 MED ORDER — WEGOVY 1.7 MG/0.75ML ~~LOC~~ SOAJ
SUBCUTANEOUS | 0 refills | Status: AC
  Filled 2022-09-17: qty 3, 28d supply, fill #0

## 2022-09-18 ENCOUNTER — Other Ambulatory Visit (HOSPITAL_BASED_OUTPATIENT_CLINIC_OR_DEPARTMENT_OTHER): Payer: Self-pay

## 2022-09-21 ENCOUNTER — Other Ambulatory Visit (HOSPITAL_BASED_OUTPATIENT_CLINIC_OR_DEPARTMENT_OTHER): Payer: Self-pay

## 2022-09-30 ENCOUNTER — Other Ambulatory Visit (HOSPITAL_COMMUNITY): Payer: Self-pay

## 2022-10-15 ENCOUNTER — Other Ambulatory Visit (HOSPITAL_BASED_OUTPATIENT_CLINIC_OR_DEPARTMENT_OTHER): Payer: Self-pay

## 2022-10-15 MED ORDER — WEGOVY 2.4 MG/0.75ML ~~LOC~~ SOAJ
2.4000 mg | SUBCUTANEOUS | 0 refills | Status: DC
  Filled 2022-10-15: qty 3, 28d supply, fill #0

## 2022-10-16 ENCOUNTER — Other Ambulatory Visit (HOSPITAL_BASED_OUTPATIENT_CLINIC_OR_DEPARTMENT_OTHER): Payer: Self-pay

## 2022-10-26 DIAGNOSIS — Z309 Encounter for contraceptive management, unspecified: Secondary | ICD-10-CM | POA: Diagnosis not present

## 2022-11-02 DIAGNOSIS — Z30431 Encounter for routine checking of intrauterine contraceptive device: Secondary | ICD-10-CM | POA: Diagnosis not present

## 2022-11-02 DIAGNOSIS — Z30430 Encounter for insertion of intrauterine contraceptive device: Secondary | ICD-10-CM | POA: Diagnosis not present

## 2022-11-20 ENCOUNTER — Other Ambulatory Visit (HOSPITAL_BASED_OUTPATIENT_CLINIC_OR_DEPARTMENT_OTHER): Payer: Self-pay

## 2022-11-20 MED ORDER — WEGOVY 2.4 MG/0.75ML ~~LOC~~ SOAJ
2.4000 mg | SUBCUTANEOUS | 0 refills | Status: DC
  Filled 2022-11-20: qty 3, 28d supply, fill #0

## 2022-11-21 ENCOUNTER — Other Ambulatory Visit (HOSPITAL_BASED_OUTPATIENT_CLINIC_OR_DEPARTMENT_OTHER): Payer: Self-pay

## 2022-12-05 DIAGNOSIS — M7542 Impingement syndrome of left shoulder: Secondary | ICD-10-CM | POA: Diagnosis not present

## 2022-12-05 DIAGNOSIS — M542 Cervicalgia: Secondary | ICD-10-CM | POA: Diagnosis not present

## 2022-12-05 DIAGNOSIS — M9901 Segmental and somatic dysfunction of cervical region: Secondary | ICD-10-CM | POA: Diagnosis not present

## 2022-12-05 DIAGNOSIS — M7918 Myalgia, other site: Secondary | ICD-10-CM | POA: Diagnosis not present

## 2022-12-05 DIAGNOSIS — M9902 Segmental and somatic dysfunction of thoracic region: Secondary | ICD-10-CM | POA: Diagnosis not present

## 2022-12-05 DIAGNOSIS — M25512 Pain in left shoulder: Secondary | ICD-10-CM | POA: Diagnosis not present

## 2022-12-05 DIAGNOSIS — M546 Pain in thoracic spine: Secondary | ICD-10-CM | POA: Diagnosis not present

## 2022-12-12 ENCOUNTER — Other Ambulatory Visit (HOSPITAL_COMMUNITY): Payer: Self-pay

## 2022-12-12 DIAGNOSIS — M25512 Pain in left shoulder: Secondary | ICD-10-CM | POA: Diagnosis not present

## 2022-12-12 DIAGNOSIS — M546 Pain in thoracic spine: Secondary | ICD-10-CM | POA: Diagnosis not present

## 2022-12-12 DIAGNOSIS — M9901 Segmental and somatic dysfunction of cervical region: Secondary | ICD-10-CM | POA: Diagnosis not present

## 2022-12-12 DIAGNOSIS — M7918 Myalgia, other site: Secondary | ICD-10-CM | POA: Diagnosis not present

## 2022-12-12 DIAGNOSIS — M542 Cervicalgia: Secondary | ICD-10-CM | POA: Diagnosis not present

## 2022-12-12 DIAGNOSIS — M7542 Impingement syndrome of left shoulder: Secondary | ICD-10-CM | POA: Diagnosis not present

## 2022-12-12 DIAGNOSIS — M9902 Segmental and somatic dysfunction of thoracic region: Secondary | ICD-10-CM | POA: Diagnosis not present

## 2022-12-12 MED ORDER — FLUOXETINE HCL 20 MG PO CAPS
20.0000 mg | ORAL_CAPSULE | Freq: Every day | ORAL | 1 refills | Status: DC
  Filled 2022-12-12: qty 90, 90d supply, fill #0
  Filled 2022-12-20: qty 90, 90d supply, fill #1
  Filled 2022-12-29: qty 90, 90d supply, fill #0

## 2022-12-14 DIAGNOSIS — Z30431 Encounter for routine checking of intrauterine contraceptive device: Secondary | ICD-10-CM | POA: Diagnosis not present

## 2022-12-14 DIAGNOSIS — R891 Abnormal level of hormones in specimens from other organs, systems and tissues: Secondary | ICD-10-CM | POA: Diagnosis not present

## 2022-12-18 ENCOUNTER — Other Ambulatory Visit (HOSPITAL_BASED_OUTPATIENT_CLINIC_OR_DEPARTMENT_OTHER): Payer: Self-pay

## 2022-12-18 DIAGNOSIS — M7542 Impingement syndrome of left shoulder: Secondary | ICD-10-CM | POA: Diagnosis not present

## 2022-12-18 DIAGNOSIS — M25512 Pain in left shoulder: Secondary | ICD-10-CM | POA: Diagnosis not present

## 2022-12-18 DIAGNOSIS — M546 Pain in thoracic spine: Secondary | ICD-10-CM | POA: Diagnosis not present

## 2022-12-18 DIAGNOSIS — M7918 Myalgia, other site: Secondary | ICD-10-CM | POA: Diagnosis not present

## 2022-12-18 DIAGNOSIS — M9901 Segmental and somatic dysfunction of cervical region: Secondary | ICD-10-CM | POA: Diagnosis not present

## 2022-12-18 DIAGNOSIS — M9902 Segmental and somatic dysfunction of thoracic region: Secondary | ICD-10-CM | POA: Diagnosis not present

## 2022-12-18 DIAGNOSIS — M542 Cervicalgia: Secondary | ICD-10-CM | POA: Diagnosis not present

## 2022-12-18 MED ORDER — WEGOVY 2.4 MG/0.75ML ~~LOC~~ SOAJ
2.4000 mg | SUBCUTANEOUS | 0 refills | Status: DC
  Filled 2022-12-18 – 2022-12-20 (×2): qty 3, 28d supply, fill #0

## 2022-12-20 ENCOUNTER — Other Ambulatory Visit (HOSPITAL_BASED_OUTPATIENT_CLINIC_OR_DEPARTMENT_OTHER): Payer: Self-pay

## 2022-12-20 ENCOUNTER — Other Ambulatory Visit (HOSPITAL_COMMUNITY): Payer: Self-pay

## 2022-12-25 ENCOUNTER — Other Ambulatory Visit: Payer: Self-pay

## 2022-12-25 ENCOUNTER — Other Ambulatory Visit (HOSPITAL_COMMUNITY): Payer: Self-pay

## 2022-12-25 ENCOUNTER — Other Ambulatory Visit (HOSPITAL_BASED_OUTPATIENT_CLINIC_OR_DEPARTMENT_OTHER): Payer: Self-pay

## 2022-12-28 ENCOUNTER — Other Ambulatory Visit (HOSPITAL_COMMUNITY): Payer: Self-pay

## 2022-12-29 ENCOUNTER — Other Ambulatory Visit (HOSPITAL_COMMUNITY): Payer: Self-pay

## 2022-12-29 ENCOUNTER — Other Ambulatory Visit: Payer: Self-pay

## 2023-01-01 ENCOUNTER — Other Ambulatory Visit (HOSPITAL_COMMUNITY): Payer: Self-pay

## 2023-01-01 ENCOUNTER — Encounter (HOSPITAL_COMMUNITY): Payer: Self-pay

## 2023-01-01 ENCOUNTER — Other Ambulatory Visit: Payer: Self-pay

## 2023-01-04 ENCOUNTER — Other Ambulatory Visit: Payer: Self-pay

## 2023-01-11 ENCOUNTER — Other Ambulatory Visit: Payer: Self-pay

## 2023-01-17 ENCOUNTER — Other Ambulatory Visit (HOSPITAL_BASED_OUTPATIENT_CLINIC_OR_DEPARTMENT_OTHER): Payer: Self-pay

## 2023-01-17 MED ORDER — WEGOVY 2.4 MG/0.75ML ~~LOC~~ SOAJ
2.4000 mg | SUBCUTANEOUS | 0 refills | Status: AC
  Filled 2023-01-17: qty 3, 28d supply, fill #0

## 2023-01-24 ENCOUNTER — Other Ambulatory Visit (HOSPITAL_BASED_OUTPATIENT_CLINIC_OR_DEPARTMENT_OTHER): Payer: Self-pay

## 2023-01-24 MED ORDER — ATOVAQUONE-PROGUANIL HCL 250-100 MG PO TABS
ORAL_TABLET | ORAL | 0 refills | Status: AC
  Filled 2023-01-24: qty 16, 16d supply, fill #0

## 2023-01-25 ENCOUNTER — Other Ambulatory Visit: Payer: Self-pay

## 2023-01-25 ENCOUNTER — Other Ambulatory Visit (HOSPITAL_BASED_OUTPATIENT_CLINIC_OR_DEPARTMENT_OTHER): Payer: Self-pay

## 2023-02-19 ENCOUNTER — Other Ambulatory Visit (HOSPITAL_COMMUNITY): Payer: Self-pay

## 2023-02-19 DIAGNOSIS — Z79899 Other long term (current) drug therapy: Secondary | ICD-10-CM | POA: Diagnosis not present

## 2023-02-19 DIAGNOSIS — Z131 Encounter for screening for diabetes mellitus: Secondary | ICD-10-CM | POA: Diagnosis not present

## 2023-02-19 DIAGNOSIS — Z13220 Encounter for screening for lipoid disorders: Secondary | ICD-10-CM | POA: Diagnosis not present

## 2023-02-19 DIAGNOSIS — F419 Anxiety disorder, unspecified: Secondary | ICD-10-CM | POA: Diagnosis not present

## 2023-02-19 DIAGNOSIS — Z130 Encounter for screening for diseases of the blood and blood-forming organs and certain disorders involving the immune mechanism: Secondary | ICD-10-CM | POA: Diagnosis not present

## 2023-02-19 DIAGNOSIS — E6609 Other obesity due to excess calories: Secondary | ICD-10-CM | POA: Diagnosis not present

## 2023-02-19 DIAGNOSIS — Z1321 Encounter for screening for nutritional disorder: Secondary | ICD-10-CM | POA: Diagnosis not present

## 2023-02-19 DIAGNOSIS — R7989 Other specified abnormal findings of blood chemistry: Secondary | ICD-10-CM | POA: Diagnosis not present

## 2023-02-19 DIAGNOSIS — Z0000 Encounter for general adult medical examination without abnormal findings: Secondary | ICD-10-CM | POA: Diagnosis not present

## 2023-02-19 DIAGNOSIS — Z23 Encounter for immunization: Secondary | ICD-10-CM | POA: Diagnosis not present

## 2023-02-19 MED ORDER — FLUOXETINE HCL 40 MG PO CAPS
40.0000 mg | ORAL_CAPSULE | Freq: Every day | ORAL | 0 refills | Status: DC
  Filled 2023-02-19: qty 90, 90d supply, fill #0

## 2023-02-19 MED ORDER — PHENTERMINE HCL 37.5 MG PO CAPS
37.5000 mg | ORAL_CAPSULE | Freq: Every morning | ORAL | 0 refills | Status: DC
  Filled 2023-02-19: qty 30, 30d supply, fill #0

## 2023-02-19 MED ORDER — MONTELUKAST SODIUM 10 MG PO TABS
10.0000 mg | ORAL_TABLET | Freq: Every day | ORAL | 1 refills | Status: DC
  Filled 2023-02-19: qty 90, 90d supply, fill #0
  Filled 2023-10-13: qty 90, 90d supply, fill #1

## 2023-02-20 ENCOUNTER — Other Ambulatory Visit (HOSPITAL_COMMUNITY): Payer: Self-pay

## 2023-02-20 MED ORDER — VITAMIN D (ERGOCALCIFEROL) 1.25 MG (50000 UNIT) PO CAPS
50000.0000 [IU] | ORAL_CAPSULE | ORAL | 0 refills | Status: DC
  Filled 2023-02-20: qty 12, 84d supply, fill #0

## 2023-04-03 DIAGNOSIS — R7989 Other specified abnormal findings of blood chemistry: Secondary | ICD-10-CM | POA: Diagnosis not present

## 2023-04-03 DIAGNOSIS — Z1331 Encounter for screening for depression: Secondary | ICD-10-CM | POA: Diagnosis not present

## 2023-04-03 DIAGNOSIS — E559 Vitamin D deficiency, unspecified: Secondary | ICD-10-CM | POA: Diagnosis not present

## 2023-04-03 DIAGNOSIS — E6609 Other obesity due to excess calories: Secondary | ICD-10-CM | POA: Diagnosis not present

## 2023-04-03 DIAGNOSIS — Z79899 Other long term (current) drug therapy: Secondary | ICD-10-CM | POA: Diagnosis not present

## 2023-04-12 DIAGNOSIS — Z1231 Encounter for screening mammogram for malignant neoplasm of breast: Secondary | ICD-10-CM | POA: Diagnosis not present

## 2023-04-12 DIAGNOSIS — R92333 Mammographic heterogeneous density, bilateral breasts: Secondary | ICD-10-CM | POA: Diagnosis not present

## 2023-04-16 ENCOUNTER — Other Ambulatory Visit (HOSPITAL_COMMUNITY): Payer: Self-pay

## 2023-04-16 MED ORDER — PHENTERMINE HCL 37.5 MG PO CAPS
37.5000 mg | ORAL_CAPSULE | Freq: Every morning | ORAL | 0 refills | Status: AC
  Filled 2023-04-16: qty 30, 30d supply, fill #0

## 2023-04-17 ENCOUNTER — Other Ambulatory Visit (HOSPITAL_COMMUNITY): Payer: Self-pay

## 2023-04-24 DIAGNOSIS — H524 Presbyopia: Secondary | ICD-10-CM | POA: Diagnosis not present

## 2023-05-02 ENCOUNTER — Other Ambulatory Visit (HOSPITAL_COMMUNITY): Payer: Self-pay

## 2023-05-02 DIAGNOSIS — E559 Vitamin D deficiency, unspecified: Secondary | ICD-10-CM | POA: Diagnosis not present

## 2023-05-02 DIAGNOSIS — R7989 Other specified abnormal findings of blood chemistry: Secondary | ICD-10-CM | POA: Diagnosis not present

## 2023-05-02 MED ORDER — FLUOXETINE HCL 20 MG PO CAPS
20.0000 mg | ORAL_CAPSULE | Freq: Every day | ORAL | 0 refills | Status: DC
  Filled 2023-05-02 – 2023-05-18 (×2): qty 90, 90d supply, fill #0

## 2023-05-02 MED ORDER — VITAMIN D (ERGOCALCIFEROL) 1.25 MG (50000 UNIT) PO CAPS
50000.0000 [IU] | ORAL_CAPSULE | ORAL | 0 refills | Status: DC
  Filled 2023-05-02 – 2023-05-18 (×2): qty 12, 84d supply, fill #0

## 2023-05-14 ENCOUNTER — Other Ambulatory Visit (HOSPITAL_COMMUNITY): Payer: Self-pay

## 2023-05-18 ENCOUNTER — Other Ambulatory Visit (HOSPITAL_COMMUNITY): Payer: Self-pay

## 2023-06-26 DIAGNOSIS — M25512 Pain in left shoulder: Secondary | ICD-10-CM | POA: Diagnosis not present

## 2023-06-26 DIAGNOSIS — M9902 Segmental and somatic dysfunction of thoracic region: Secondary | ICD-10-CM | POA: Diagnosis not present

## 2023-06-26 DIAGNOSIS — M9901 Segmental and somatic dysfunction of cervical region: Secondary | ICD-10-CM | POA: Diagnosis not present

## 2023-06-26 DIAGNOSIS — M9903 Segmental and somatic dysfunction of lumbar region: Secondary | ICD-10-CM | POA: Diagnosis not present

## 2023-07-02 DIAGNOSIS — Z6828 Body mass index (BMI) 28.0-28.9, adult: Secondary | ICD-10-CM | POA: Diagnosis not present

## 2023-07-02 DIAGNOSIS — N393 Stress incontinence (female) (male): Secondary | ICD-10-CM | POA: Diagnosis not present

## 2023-07-02 DIAGNOSIS — Z01419 Encounter for gynecological examination (general) (routine) without abnormal findings: Secondary | ICD-10-CM | POA: Diagnosis not present

## 2023-07-04 DIAGNOSIS — M9901 Segmental and somatic dysfunction of cervical region: Secondary | ICD-10-CM | POA: Diagnosis not present

## 2023-07-04 DIAGNOSIS — M9902 Segmental and somatic dysfunction of thoracic region: Secondary | ICD-10-CM | POA: Diagnosis not present

## 2023-07-04 DIAGNOSIS — M25512 Pain in left shoulder: Secondary | ICD-10-CM | POA: Diagnosis not present

## 2023-07-04 DIAGNOSIS — M9903 Segmental and somatic dysfunction of lumbar region: Secondary | ICD-10-CM | POA: Diagnosis not present

## 2023-07-10 DIAGNOSIS — M25512 Pain in left shoulder: Secondary | ICD-10-CM | POA: Diagnosis not present

## 2023-07-10 DIAGNOSIS — M9902 Segmental and somatic dysfunction of thoracic region: Secondary | ICD-10-CM | POA: Diagnosis not present

## 2023-07-10 DIAGNOSIS — M9901 Segmental and somatic dysfunction of cervical region: Secondary | ICD-10-CM | POA: Diagnosis not present

## 2023-07-10 DIAGNOSIS — M9903 Segmental and somatic dysfunction of lumbar region: Secondary | ICD-10-CM | POA: Diagnosis not present

## 2023-07-13 ENCOUNTER — Other Ambulatory Visit (HOSPITAL_COMMUNITY): Payer: Self-pay

## 2023-07-13 MED ORDER — FLUOXETINE HCL 20 MG PO CAPS: 20.0000 mg | ORAL_CAPSULE | Freq: Every day | ORAL | 0 refills | Status: DC

## 2023-07-19 ENCOUNTER — Other Ambulatory Visit (HOSPITAL_COMMUNITY): Payer: Self-pay

## 2023-07-19 MED ORDER — FLUOXETINE HCL 40 MG PO CAPS
40.0000 mg | ORAL_CAPSULE | Freq: Every day | ORAL | 0 refills | Status: AC
  Filled 2023-07-19: qty 90, 90d supply, fill #0

## 2023-07-20 ENCOUNTER — Other Ambulatory Visit (HOSPITAL_COMMUNITY): Payer: Self-pay

## 2023-07-20 MED ORDER — FLUOXETINE HCL 40 MG PO CAPS
40.0000 mg | ORAL_CAPSULE | Freq: Every day | ORAL | 0 refills | Status: DC
  Filled 2023-07-20 – 2023-10-13 (×2): qty 90, 90d supply, fill #0

## 2023-07-24 DIAGNOSIS — M25512 Pain in left shoulder: Secondary | ICD-10-CM | POA: Diagnosis not present

## 2023-07-24 DIAGNOSIS — M9901 Segmental and somatic dysfunction of cervical region: Secondary | ICD-10-CM | POA: Diagnosis not present

## 2023-07-24 DIAGNOSIS — M9902 Segmental and somatic dysfunction of thoracic region: Secondary | ICD-10-CM | POA: Diagnosis not present

## 2023-07-24 DIAGNOSIS — M9903 Segmental and somatic dysfunction of lumbar region: Secondary | ICD-10-CM | POA: Diagnosis not present

## 2023-07-31 DIAGNOSIS — M9903 Segmental and somatic dysfunction of lumbar region: Secondary | ICD-10-CM | POA: Diagnosis not present

## 2023-07-31 DIAGNOSIS — M9902 Segmental and somatic dysfunction of thoracic region: Secondary | ICD-10-CM | POA: Diagnosis not present

## 2023-07-31 DIAGNOSIS — M25512 Pain in left shoulder: Secondary | ICD-10-CM | POA: Diagnosis not present

## 2023-07-31 DIAGNOSIS — M9901 Segmental and somatic dysfunction of cervical region: Secondary | ICD-10-CM | POA: Diagnosis not present

## 2023-08-08 DIAGNOSIS — M9903 Segmental and somatic dysfunction of lumbar region: Secondary | ICD-10-CM | POA: Diagnosis not present

## 2023-08-08 DIAGNOSIS — M25512 Pain in left shoulder: Secondary | ICD-10-CM | POA: Diagnosis not present

## 2023-08-08 DIAGNOSIS — M9902 Segmental and somatic dysfunction of thoracic region: Secondary | ICD-10-CM | POA: Diagnosis not present

## 2023-08-08 DIAGNOSIS — M9901 Segmental and somatic dysfunction of cervical region: Secondary | ICD-10-CM | POA: Diagnosis not present

## 2023-08-15 DIAGNOSIS — M25512 Pain in left shoulder: Secondary | ICD-10-CM | POA: Diagnosis not present

## 2023-08-15 DIAGNOSIS — M9901 Segmental and somatic dysfunction of cervical region: Secondary | ICD-10-CM | POA: Diagnosis not present

## 2023-08-15 DIAGNOSIS — M9903 Segmental and somatic dysfunction of lumbar region: Secondary | ICD-10-CM | POA: Diagnosis not present

## 2023-08-15 DIAGNOSIS — M9902 Segmental and somatic dysfunction of thoracic region: Secondary | ICD-10-CM | POA: Diagnosis not present

## 2023-10-13 ENCOUNTER — Other Ambulatory Visit (HOSPITAL_COMMUNITY): Payer: Self-pay

## 2023-10-25 DIAGNOSIS — R350 Frequency of micturition: Secondary | ICD-10-CM | POA: Diagnosis not present

## 2023-10-25 DIAGNOSIS — N8111 Cystocele, midline: Secondary | ICD-10-CM | POA: Diagnosis not present

## 2023-10-25 DIAGNOSIS — N3946 Mixed incontinence: Secondary | ICD-10-CM | POA: Diagnosis not present

## 2023-10-29 ENCOUNTER — Other Ambulatory Visit (HOSPITAL_COMMUNITY): Payer: Self-pay

## 2023-10-29 MED ORDER — AMOXICILLIN 500 MG PO CAPS
500.0000 mg | ORAL_CAPSULE | Freq: Three times a day (TID) | ORAL | 0 refills | Status: AC
  Filled 2023-10-29: qty 21, 7d supply, fill #0

## 2023-11-02 ENCOUNTER — Other Ambulatory Visit (HOSPITAL_COMMUNITY): Payer: Self-pay

## 2023-11-02 MED ORDER — AZITHROMYCIN 250 MG PO TABS
ORAL_TABLET | ORAL | 0 refills | Status: AC
  Filled 2023-11-02: qty 6, 5d supply, fill #0

## 2023-11-02 MED ORDER — BENZONATATE 200 MG PO CAPS
200.0000 mg | ORAL_CAPSULE | Freq: Three times a day (TID) | ORAL | 0 refills | Status: AC | PRN
  Filled 2023-11-02: qty 20, 7d supply, fill #0

## 2023-12-13 DIAGNOSIS — N3946 Mixed incontinence: Secondary | ICD-10-CM | POA: Diagnosis not present

## 2024-01-04 ENCOUNTER — Other Ambulatory Visit (HOSPITAL_COMMUNITY): Payer: Self-pay

## 2024-01-11 ENCOUNTER — Other Ambulatory Visit (HOSPITAL_COMMUNITY): Payer: Self-pay

## 2024-01-11 MED ORDER — CIPROFLOXACIN HCL 250 MG PO TABS
250.0000 mg | ORAL_TABLET | Freq: Two times a day (BID) | ORAL | 0 refills | Status: DC
  Filled 2024-01-11: qty 6, 3d supply, fill #0

## 2024-01-14 DIAGNOSIS — N3946 Mixed incontinence: Secondary | ICD-10-CM | POA: Diagnosis not present

## 2024-01-18 ENCOUNTER — Other Ambulatory Visit (HOSPITAL_COMMUNITY): Payer: Self-pay

## 2024-01-18 MED ORDER — FLUOXETINE HCL 40 MG PO CAPS
40.0000 mg | ORAL_CAPSULE | Freq: Every day | ORAL | 0 refills | Status: DC
  Filled 2024-01-18: qty 90, 90d supply, fill #0

## 2024-01-21 ENCOUNTER — Other Ambulatory Visit (HOSPITAL_COMMUNITY): Payer: Self-pay

## 2024-01-22 DIAGNOSIS — N3642 Intrinsic sphincter deficiency (ISD): Secondary | ICD-10-CM | POA: Diagnosis not present

## 2024-01-22 DIAGNOSIS — N3946 Mixed incontinence: Secondary | ICD-10-CM | POA: Diagnosis not present

## 2024-02-01 ENCOUNTER — Other Ambulatory Visit (HOSPITAL_COMMUNITY): Payer: Self-pay

## 2024-02-01 MED ORDER — CIPROFLOXACIN HCL 250 MG PO TABS
250.0000 mg | ORAL_TABLET | Freq: Two times a day (BID) | ORAL | 0 refills | Status: AC
  Filled 2024-02-01: qty 14, 7d supply, fill #0

## 2024-02-14 DIAGNOSIS — R350 Frequency of micturition: Secondary | ICD-10-CM | POA: Diagnosis not present

## 2024-02-14 DIAGNOSIS — N3946 Mixed incontinence: Secondary | ICD-10-CM | POA: Diagnosis not present

## 2024-03-12 ENCOUNTER — Other Ambulatory Visit (HOSPITAL_COMMUNITY): Payer: Self-pay

## 2024-03-12 MED ORDER — FLUOXETINE HCL 40 MG PO CAPS
40.0000 mg | ORAL_CAPSULE | Freq: Every day | ORAL | 1 refills | Status: AC
  Filled 2024-05-01: qty 90, 90d supply, fill #0
  Filled 2024-10-24: qty 30, 30d supply, fill #1

## 2024-03-13 ENCOUNTER — Other Ambulatory Visit (HOSPITAL_COMMUNITY): Payer: Self-pay

## 2024-03-13 MED ORDER — MONTELUKAST SODIUM 10 MG PO TABS
10.0000 mg | ORAL_TABLET | Freq: Every day | ORAL | 1 refills | Status: AC
  Filled 2024-03-13: qty 90, 90d supply, fill #0
  Filled 2024-10-24: qty 30, 30d supply, fill #1

## 2024-03-14 ENCOUNTER — Other Ambulatory Visit (HOSPITAL_COMMUNITY): Payer: Self-pay

## 2024-03-14 MED ORDER — VITAMIN D (ERGOCALCIFEROL) 1.25 MG (50000 UNIT) PO CAPS
50000.0000 [IU] | ORAL_CAPSULE | ORAL | 0 refills | Status: AC
  Filled 2024-03-14: qty 12, 84d supply, fill #0

## 2024-03-18 ENCOUNTER — Other Ambulatory Visit (HOSPITAL_COMMUNITY): Payer: Self-pay

## 2024-03-18 ENCOUNTER — Encounter (HOSPITAL_COMMUNITY): Payer: Self-pay

## 2024-03-18 MED ORDER — ZEPBOUND 5 MG/0.5ML ~~LOC~~ SOAJ
5.0000 mg | SUBCUTANEOUS | 0 refills | Status: AC
  Filled 2024-03-18 – 2024-03-20 (×3): qty 2, 28d supply, fill #0

## 2024-03-20 ENCOUNTER — Other Ambulatory Visit (HOSPITAL_COMMUNITY): Payer: Self-pay

## 2024-03-27 DIAGNOSIS — R92333 Mammographic heterogeneous density, bilateral breasts: Secondary | ICD-10-CM | POA: Diagnosis not present

## 2024-03-27 DIAGNOSIS — Z1231 Encounter for screening mammogram for malignant neoplasm of breast: Secondary | ICD-10-CM | POA: Diagnosis not present

## 2024-03-31 DIAGNOSIS — K760 Fatty (change of) liver, not elsewhere classified: Secondary | ICD-10-CM | POA: Diagnosis not present

## 2024-03-31 DIAGNOSIS — K8020 Calculus of gallbladder without cholecystitis without obstruction: Secondary | ICD-10-CM | POA: Diagnosis not present

## 2024-04-08 DIAGNOSIS — G4733 Obstructive sleep apnea (adult) (pediatric): Secondary | ICD-10-CM | POA: Diagnosis not present

## 2024-05-01 ENCOUNTER — Other Ambulatory Visit (HOSPITAL_COMMUNITY): Payer: Self-pay

## 2024-05-24 DIAGNOSIS — H524 Presbyopia: Secondary | ICD-10-CM | POA: Diagnosis not present

## 2024-07-09 DIAGNOSIS — Z113 Encounter for screening for infections with a predominantly sexual mode of transmission: Secondary | ICD-10-CM | POA: Diagnosis not present

## 2024-07-25 ENCOUNTER — Other Ambulatory Visit (HOSPITAL_COMMUNITY): Payer: Self-pay

## 2024-07-25 ENCOUNTER — Other Ambulatory Visit: Payer: Self-pay

## 2024-07-25 MED ORDER — FLUOXETINE HCL 40 MG PO CAPS
40.0000 mg | ORAL_CAPSULE | Freq: Every day | ORAL | 1 refills | Status: AC
  Filled 2024-07-25: qty 90, 90d supply, fill #0
  Filled 2024-11-27: qty 30, 30d supply, fill #1

## 2024-07-25 MED ORDER — VITAMIN D (ERGOCALCIFEROL) 1.25 MG (50000 UNIT) PO CAPS
50000.0000 [IU] | ORAL_CAPSULE | ORAL | 0 refills | Status: AC
  Filled 2024-07-25: qty 12, 84d supply, fill #0

## 2024-07-25 MED ORDER — MONTELUKAST SODIUM 10 MG PO TABS
10.0000 mg | ORAL_TABLET | Freq: Every day | ORAL | 1 refills | Status: AC
  Filled 2024-07-25: qty 90, 90d supply, fill #0

## 2024-08-04 DIAGNOSIS — G4733 Obstructive sleep apnea (adult) (pediatric): Secondary | ICD-10-CM | POA: Diagnosis not present

## 2024-08-12 DIAGNOSIS — G4733 Obstructive sleep apnea (adult) (pediatric): Secondary | ICD-10-CM | POA: Diagnosis not present

## 2024-09-17 ENCOUNTER — Other Ambulatory Visit (HOSPITAL_COMMUNITY): Payer: Self-pay

## 2024-09-17 MED ORDER — AMOXICILLIN-POT CLAVULANATE 875-125 MG PO TABS
1.0000 | ORAL_TABLET | Freq: Two times a day (BID) | ORAL | 0 refills | Status: AC
  Filled 2024-09-17: qty 20, 10d supply, fill #0

## 2024-10-24 ENCOUNTER — Other Ambulatory Visit (HOSPITAL_COMMUNITY): Payer: Self-pay

## 2024-11-27 ENCOUNTER — Other Ambulatory Visit (HOSPITAL_COMMUNITY): Payer: Self-pay

## 2024-12-05 ENCOUNTER — Other Ambulatory Visit: Payer: Self-pay
